# Patient Record
Sex: Male | Born: 1990 | Race: White | Hispanic: No | Marital: Single | Smoking: Current every day smoker
Health system: Southern US, Community
[De-identification: ages and names within clinical notes are randomized; demographics above are authoritative.]

## PROBLEM LIST (undated history)

## (undated) DIAGNOSIS — F410 Panic disorder [episodic paroxysmal anxiety] without agoraphobia: Secondary | ICD-10-CM

## (undated) DIAGNOSIS — F419 Anxiety disorder, unspecified: Secondary | ICD-10-CM

## (undated) DIAGNOSIS — K219 Gastro-esophageal reflux disease without esophagitis: Secondary | ICD-10-CM

---
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
## 2011-06-25 ENCOUNTER — Encounter (HOSPITAL_BASED_OUTPATIENT_CLINIC_OR_DEPARTMENT_OTHER): Payer: Self-pay | Admitting: Emergency Medicine

## 2011-06-25 ENCOUNTER — Emergency Department (HOSPITAL_BASED_OUTPATIENT_CLINIC_OR_DEPARTMENT_OTHER)
Admission: EM | Admit: 2011-06-25 | Discharge: 2011-06-25 | Disposition: A | Discharge status: Home / Self Care | Payer: 59 | Attending: Emergency Medicine | Admitting: Emergency Medicine

## 2011-06-25 DIAGNOSIS — S01409A Unspecified open wound of unspecified cheek and temporomandibular area, initial encounter: Secondary | ICD-10-CM | POA: Insufficient documentation

## 2011-06-25 DIAGNOSIS — W540XXA Bitten by dog, initial encounter: Secondary | ICD-10-CM | POA: Insufficient documentation

## 2011-06-25 DIAGNOSIS — S0185XA Open bite of other part of head, initial encounter: Secondary | ICD-10-CM

## 2011-06-25 DIAGNOSIS — J45909 Unspecified asthma, uncomplicated: Secondary | ICD-10-CM | POA: Insufficient documentation

## 2011-06-25 DIAGNOSIS — K219 Gastro-esophageal reflux disease without esophagitis: Secondary | ICD-10-CM | POA: Insufficient documentation

## 2011-06-25 HISTORY — DX: Gastro-esophageal reflux disease without esophagitis: K21.9

## 2011-06-25 MED ORDER — FLUORESCEIN SODIUM 1 MG OP STRP
ORAL_STRIP | OPHTHALMIC | Status: AC
  Filled 2011-06-25: qty 1

## 2011-06-25 MED ORDER — TETRACAINE HCL 0.5 % OP SOLN
OPHTHALMIC | Status: AC
  Filled 2011-06-25: qty 2

## 2011-06-25 MED ORDER — IBUPROFEN 800 MG PO TABS: 800.0000 mg | ORAL_TABLET | Freq: Three times a day (TID) | ORAL | Status: AC

## 2011-06-25 MED ORDER — AMOXICILLIN-POT CLAVULANATE 875-125 MG PO TABS
1.0000 | ORAL_TABLET | Freq: Once | ORAL | Status: AC
  Administered 2011-06-25: 1 via ORAL
  Filled 2011-06-25: qty 1

## 2011-06-25 MED ORDER — AMOXICILLIN-POT CLAVULANATE 875-125 MG PO TABS: 1.0000 | ORAL_TABLET | Freq: Two times a day (BID) | ORAL | Status: AC

## 2011-06-25 MED ORDER — TETANUS-DIPHTH-ACELL PERTUSSIS 5-2.5-18.5 LF-MCG/0.5 IM SUSP: 0.5000 mL | Freq: Once | INTRAMUSCULAR | Status: DC

## 2011-06-25 NOTE — ED Notes (Signed)
Pt has laceration to right cheek from dog bite.

## 2011-06-25 NOTE — ED Provider Notes (Signed)
History     CSN: [id number]  Arrival date & time 06/25/11  8295   First MD Initiated Contact with Patient 06/25/11 0118      Chief Complaint  Patient presents with  . Animal Bite    (Consider location/radiation/quality/duration/timing/severity/associated sxs/prior treatment) HPI Dog bite prior to arrival. Known animal with immunizations up-to-date. Patient's tetanus is up-to-date. Dog bit right cheek. Bleeding controlled prior to arrival. Sharp pain. No radiation of pain. Moderate in severity. No known aggravating or alleviating factors. Unchanged since onset. No associated dental injury, nosebleed. No other bite or injury. Past Medical History  Diagnosis Date  . Asthma   . GERD (gastroesophageal reflux disease)     History reviewed. No pertinent past surgical history.  No family history on file.  History  Substance Use Topics  . Smoking status: Current Everyday Smoker  . Smokeless tobacco: Not on file  . Alcohol Use: Yes      Review of Systems  Constitutional: Negative for fever and chills.  HENT: Negative for neck pain and neck stiffness.   Eyes: Negative for pain.  Respiratory: Negative for shortness of breath.   Cardiovascular: Negative for chest pain.  Gastrointestinal: Negative for abdominal pain.  Genitourinary: Negative for dysuria.  Musculoskeletal: Negative for back pain.  Skin: Positive for wound. Negative for rash.  Neurological: Negative for headaches.  All other systems reviewed and are negative.    Allergies  Review of patient's allergies indicates no known allergies.  Home Medications   Current Outpatient Rx  Name Route Sig Dispense Refill  . OMEPRAZOLE 20 MG PO CPDR Oral Take 20 mg by mouth daily.      BP 133/70  Pulse 103  Temp(Src) 98.6 F (37 C) (Oral)  Resp 20  Ht 6' 1.5" (1.867 m)  Wt 285 lb (129.275 kg)  BMI 37.09 kg/m2  SpO2 100%  Physical Exam  Constitutional: He is oriented to person, place, and time. He appears  well-developed and well-nourished.  HENT:  Head: Normocephalic.       Superficial linear wound to right face with minimal gape. No active bleeding. No associated midface instability or bony tenderness. No epistaxis. No septal hematoma. Extraocular movements intact and conjunctiva clear. No trismus. No dental tenderness.  Eyes: Conjunctivae and EOM are normal. Pupils are equal, round, and reactive to light.  Neck: Trachea normal. Neck supple.  Cardiovascular: Normal rate, regular rhythm, S1 normal, S2 normal and normal pulses.     No systolic murmur is present   No diastolic murmur is present  Pulses:      Radial pulses are 2+ on the right side, and 2+ on the left side.  Pulmonary/Chest: Effort normal and breath sounds normal. He has no wheezes. He has no rhonchi. He has no rales. He exhibits no tenderness.  Abdominal: Soft. Normal appearance and bowel sounds are normal. There is no tenderness. There is no CVA tenderness and negative Murphy's sign.  Musculoskeletal:       BLE:s Calves nontender, no cords or erythema, negative Homans sign  Neurological: He is alert and oriented to person, place, and time. He has normal strength. No cranial nerve deficit or sensory deficit. GCS eye subscore is 4. GCS verbal subscore is 5. GCS motor subscore is 6.  Skin: Skin is warm and dry. No rash noted. He is not diaphoretic.  Psychiatric: His speech is normal.       Cooperative and appropriate    ED Course  Procedures (including critical care time)  Wound  irrigated extensively with normal saline. Wound washed with soap and water.  Augmentin provided.  Tetanus up-to-date.  MDM   Dog bite to face. Superficial. Decision made to place Steri-Strips and not sutures. Plan recheck 48 hours. Reliable historian verbalizes understanding infection precautions and need for close followup. Prescription for Augmentin provided.        Sunnie Nielsen, MD 06/25/11 [phone]

## 2011-06-25 NOTE — ED Notes (Signed)
Wound to right cheek area irrigated with NS, pt tolerated well

## 2011-06-25 NOTE — Discharge Instructions (Signed)
Animal Bite An animal bite can result in a scratch on the skin, deep open cut, puncture of the skin, crush injury, or tearing away of the skin or a body part. Dogs are responsible for most animal bites. Children are bitten more often than adults. An animal bite can range from very mild to more serious. A small bite from your house pet is no cause for alarm. However, some animal bites can become infected or injure a bone or other tissue. You must seek medical care if:  The skin is broken and bleeding does not slow down or stop after 15 minutes.   The puncture is deep and difficult to clean (such as a cat bite).   Pain, warmth, redness, or pus develops around the wound.   The bite is from a stray animal or rodent. There may be a risk of rabies infection.   The bite is from a snake, raccoon, skunk, fox, coyote, or bat. There may be a risk of rabies infection.   The person bitten has a chronic illness such as diabetes, liver disease, or cancer, or the person takes medicine that lowers the immune system.   There is concern about the location and severity of the bite.  It is important to clean and protect an animal bite wound right away to prevent infection. Follow these steps:  Clean the wound with plenty of water and soap.   Apply an antibiotic cream.   Apply gentle pressure over the wound with a clean towel or gauze to slow or stop bleeding.   Elevate the affected area above the heart to help stop any bleeding.   Seek medical care. Getting medical care within 8 hours of the animal bite leads to the best possible outcome.  DIAGNOSIS  Your caregiver will most likely:  Take a detailed history of the animal and the bite injury.   Perform a wound exam.   Take your medical history.  Blood tests or X-rays may be performed. Sometimes, infected bite wounds are cultured and sent to a lab to identify the infectious bacteria.  TREATMENT  Medical treatment will depend on the location and type of  animal bite as well as the patient's medical history. Treatment may include:  Wound care, such as cleaning and flushing the wound with saline solution, bandaging, and elevating the affected area.   Antibiotics.   Tetanus immunization.   Rabies immunization.   Leaving the wound open to heal. This is often done with animal bites, due to the high risk of infection. However, in certain cases, wound closure with stitches, wound adhesive, skin adhesive strips, or staples may be used.  Infected bites that are left untreated may require intravenous (IV) antibiotics and surgical treatment in the hospital. HOME CARE INSTRUCTIONS  Follow your caregiver's instructions for wound care.   Take all medicines as directed.   If your caregiver prescribes antibiotics, take them as directed. Finish them even if you start to feel better.   Follow up with your caregiver for further exams or immunizations as directed.  You may need a tetanus shot if:  You cannot remember when you had your last tetanus shot.   You have never had a tetanus shot.   The injury broke your skin.  If you get a tetanus shot, your arm may swell, get red, and feel warm to the touch. This is common and not a problem. If you need a tetanus shot and you choose not to have one, there is a   rare chance of getting tetanus. Sickness from tetanus can be serious. SEEK MEDICAL CARE IF:  You notice warmth, redness, soreness, swelling, pus discharge, or a bad smell coming from the wound.   You have a red line on the skin coming from the wound.   You have a fever, chills, or a general ill feeling.   You have nausea or vomiting.   You have continued or worsening pain.   You have trouble moving the injured part.   You have other questions or concerns.  MAKE SURE YOU:  Understand these instructions.   Will watch your condition.   Will get help right away if you are not doing well or get worse.  Document Released: 12/18/2010  Document Revised: 03/21/2011 Document Reviewed: 12/18/2010 [facility] Patient Information 2012 [place], [place]  .Facial Laceration A facial laceration is a cut on the face. Lacerations usually heal quickly, but they need special care to reduce scarring. It will take 1 to 2 years for the scar to lose its redness and to heal completely. TREATMENT  Some facial lacerations may not require closure. Some lacerations may not be able to be closed due to an increased risk of infection. It is important to see your caregiver as soon as possible after an injury to minimize the risk of infection and to maximize the opportunity for successful closure. If closure is appropriate, pain medicines may be given, if needed. The wound will be cleaned to help prevent infection. Your caregiver will use stitches (sutures), staples, wound glue (adhesive), or skin adhesive strips to repair the laceration. These tools bring the skin edges together to allow for faster healing and a better cosmetic outcome. However, all wounds will heal with a scar.  Once the wound has healed, scarring can be minimized by covering the wound with sunscreen during the day for 1 full year. Use a sunscreen with an SPF of at least 30. Sunscreen helps to reduce the pigment that will form in the scar. When applying sunscreen to a completely healed wound, massage the scar for a few minutes to help reduce the appearance of the scar. Use circular motions with your fingertips, on and around the scar. Do not massage a healing wound.   Keep the wound clean and dry.   Do not get the skin adhesive strips wet. You may bathe carefully, using caution to keep the wound dry.   If the wound gets wet, pat it dry with a clean towel.   Skin adhesive strips will fall off on their own. You may trim the strips as the wound heals. Do not remove skin adhesive strips that are still stuck to the wound. They will fall off in time.   SEEK IMMEDIATE MEDICAL CARE IF:  You  develop redness, pain, or swelling around the wound.   There is yellowish-white fluid (pus) coming from the wound.   You develop chills or a fever.

## 2011-06-25 NOTE — ED Notes (Signed)
Pt states that he had a tetanus shot 1 year ago

## 2012-02-06 ENCOUNTER — Emergency Department (HOSPITAL_BASED_OUTPATIENT_CLINIC_OR_DEPARTMENT_OTHER): Payer: 59

## 2012-02-06 ENCOUNTER — Encounter (HOSPITAL_BASED_OUTPATIENT_CLINIC_OR_DEPARTMENT_OTHER): Payer: Self-pay | Admitting: *Deleted

## 2012-02-06 ENCOUNTER — Emergency Department (HOSPITAL_BASED_OUTPATIENT_CLINIC_OR_DEPARTMENT_OTHER)
Admission: EM | Admit: 2012-02-06 | Discharge: 2012-02-06 | Disposition: A | Discharge status: Home / Self Care | Payer: 59 | Attending: Emergency Medicine | Admitting: Emergency Medicine

## 2012-02-06 DIAGNOSIS — R0602 Shortness of breath: Secondary | ICD-10-CM | POA: Insufficient documentation

## 2012-02-06 DIAGNOSIS — K219 Gastro-esophageal reflux disease without esophagitis: Secondary | ICD-10-CM | POA: Insufficient documentation

## 2012-02-06 DIAGNOSIS — J40 Bronchitis, not specified as acute or chronic: Secondary | ICD-10-CM | POA: Insufficient documentation

## 2012-02-06 DIAGNOSIS — F17200 Nicotine dependence, unspecified, uncomplicated: Secondary | ICD-10-CM | POA: Insufficient documentation

## 2012-02-06 DIAGNOSIS — J45909 Unspecified asthma, uncomplicated: Secondary | ICD-10-CM | POA: Insufficient documentation

## 2012-02-06 MED ORDER — ALBUTEROL SULFATE HFA 108 (90 BASE) MCG/ACT IN AERS: 1.0000 | INHALATION_SPRAY | Freq: Four times a day (QID) | RESPIRATORY_TRACT | Status: DC | PRN

## 2012-02-06 MED ORDER — AZITHROMYCIN 250 MG PO TABS: ORAL_TABLET | ORAL | Status: DC

## 2012-02-06 NOTE — ED Provider Notes (Signed)
History     CSN: [id number]  Arrival date & time 02/06/12  1541   First MD Initiated Contact with Patient 02/06/12 1556      Chief Complaint  Patient presents with  . URI    (Consider location/radiation/quality/duration/timing/severity/associated sxs/prior treatment) Patient is a 21 y.o. male presenting with cough. The history is provided by the patient. No language interpreter was used.  Cough This is a new problem. The current episode started yesterday. The problem occurs constantly. The problem has been gradually worsening. The cough is productive of sputum. There has been no fever. Associated symptoms include shortness of breath. He has tried nothing for the symptoms. He is a smoker. His past medical history is significant for asthma. His past medical history does not include pneumonia.  Pt complains of chest feeling tight and feeling congested  Past Medical History  Diagnosis Date  . Asthma   . GERD (gastroesophageal reflux disease)     History reviewed. No pertinent past surgical history.  No family history on file.  History  Substance Use Topics  . Smoking status: Current Every Day Smoker -- 0.5 packs/day    Types: Cigarettes  . Smokeless tobacco: Not on file  . Alcohol Use: Yes      Review of Systems  Respiratory: Positive for cough and shortness of breath.   All other systems reviewed and are negative.    Allergies  Review of patient's allergies indicates no known allergies.  Home Medications   Current Outpatient Rx  Name Route Sig Dispense Refill  . OMEPRAZOLE 20 MG PO CPDR Oral Take 20 mg by mouth daily.      BP 127/97  Pulse 85  Temp 99.3 F (37.4 C) (Oral)  Resp 20  Ht 6' 2.5" (1.892 m)  Wt 255 lb (115.667 kg)  BMI 32.30 kg/m2  SpO2 100%  Physical Exam  Nursing note and vitals reviewed. Constitutional: He is oriented to person, place, and time. He appears well-developed and well-nourished.  HENT:  Head: Normocephalic.  Right Ear:  External ear normal.  Left Ear: External ear normal.  Nose: Nose normal.  Mouth/Throat: Oropharynx is clear and moist.  Eyes: Conjunctivae normal and EOM are normal. Pupils are equal, round, and reactive to light.  Neck: Normal range of motion. Neck supple.  Cardiovascular: Normal rate and normal heart sounds.   Pulmonary/Chest: Effort normal and breath sounds normal.  Abdominal: Soft.  Neurological: He is alert and oriented to person, place, and time.  Skin: Skin is warm.  Psychiatric: He has a normal mood and affect.    ED Course  Procedures (including critical care time)  Labs Reviewed - No data to display Dg Chest 2 View  02/06/2012  *RADIOLOGY REPORT*  Clinical Data: Cough, congestion, smoker, asthma  CHEST - 2 VIEW  Comparison: 12/19/2008  Findings: Normal heart size, mediastinal contours, and pulmonary vascularity. Minimal chronic peribronchial thickening. Lungs otherwise clear. No pleural effusion or pneumothorax. No acute bony abnormalities.  IMPRESSION: Minimal chronic peribronchial thickening which could reflect chronic bronchitis or asthma. No acute abnormalities.   Original Report Authenticated By: Lollie Marrow, M.D.      No diagnosis found.    MDM  Chest xray no pneumonia,   Pt given rx for zithromax and albuterol inhaler.          [name], [place] 02/06/12 1711

## 2012-02-06 NOTE — ED Notes (Signed)
States he thinks he has a sinus infection. Has a stuffy nose, cough, and ear pain. Symptoms started 3 days ago.

## 2012-02-07 NOTE — ED Provider Notes (Deleted)
Medical screening examination/treatment/procedure(s) were conducted as a shared visit with non-physician practitioner(s) and myself.  I personally evaluated the patient during the encounter   Shelda Jakes, MD 02/07/12 1259

## 2012-02-07 NOTE — ED Provider Notes (Signed)
Medical screening examination/treatment/procedure(s) were performed by non-physician practitioner and as supervising physician I was immediately available for consultation/collaboration.   Not a shared visit.    Shelda Jakes, MD 02/07/12 [phone]

## 2013-05-02 ENCOUNTER — Emergency Department (HOSPITAL_BASED_OUTPATIENT_CLINIC_OR_DEPARTMENT_OTHER): Payer: Managed Care, Other (non HMO)

## 2013-05-02 ENCOUNTER — Emergency Department (HOSPITAL_BASED_OUTPATIENT_CLINIC_OR_DEPARTMENT_OTHER)
Admission: EM | Admit: 2013-05-02 | Discharge: 2013-05-02 | Disposition: A | Discharge status: Home / Self Care | Payer: Managed Care, Other (non HMO) | Attending: Emergency Medicine | Admitting: Emergency Medicine

## 2013-05-02 ENCOUNTER — Encounter (HOSPITAL_BASED_OUTPATIENT_CLINIC_OR_DEPARTMENT_OTHER): Payer: Self-pay | Admitting: Emergency Medicine

## 2013-05-02 DIAGNOSIS — Z79899 Other long term (current) drug therapy: Secondary | ICD-10-CM | POA: Insufficient documentation

## 2013-05-02 DIAGNOSIS — J029 Acute pharyngitis, unspecified: Secondary | ICD-10-CM | POA: Insufficient documentation

## 2013-05-02 DIAGNOSIS — F17200 Nicotine dependence, unspecified, uncomplicated: Secondary | ICD-10-CM | POA: Insufficient documentation

## 2013-05-02 DIAGNOSIS — J45909 Unspecified asthma, uncomplicated: Secondary | ICD-10-CM | POA: Insufficient documentation

## 2013-05-02 DIAGNOSIS — IMO0002 Reserved for concepts with insufficient information to code with codable children: Secondary | ICD-10-CM | POA: Insufficient documentation

## 2013-05-02 DIAGNOSIS — K219 Gastro-esophageal reflux disease without esophagitis: Secondary | ICD-10-CM | POA: Insufficient documentation

## 2013-05-02 DIAGNOSIS — J40 Bronchitis, not specified as acute or chronic: Secondary | ICD-10-CM

## 2013-05-02 MED ORDER — AEROCHAMBER PLUS W/MASK MISC: Status: DC

## 2013-05-02 MED ORDER — ALBUTEROL SULFATE HFA 108 (90 BASE) MCG/ACT IN AERS: 2.0000 | INHALATION_SPRAY | RESPIRATORY_TRACT | Status: DC | PRN

## 2013-05-02 NOTE — Discharge Instructions (Signed)
Bronchitis Use your albuterol inhaler as directed. See Dr. Lysbeth GalasNyland in the office if not improved in 2 weeks. Ask Dr. Lysbeth GalasNyland to help you to stop smoking. Return if you feel worse for any reason Bronchitis is swelling (inflammation) of the air tubes leading to your lungs (bronchi). This causes mucus and a cough. If the swelling gets bad, you may have trouble breathing. HOME CARE   Rest.  Drink enough fluids to keep your pee (urine) clear or pale yellow (unless you have a condition where you have to watch how much you drink).  Only take medicine as told by your doctor. If you were given antibiotic medicines, finish them even if you start to feel better.  Avoid smoke, irritating chemicals, and strong smells. These make the problem worse. Quit smoking if you smoke. This helps your lungs heal faster.  Use a cool mist humidifier. Change the water in the humidifier every day. You can also sit in the bathroom with hot shower running for 5 10 minutes. Keep the door closed.  See your health care provider as told.  Wash your hands often. GET HELP IF: Your problems do not get better after 1 week. GET HELP RIGHT AWAY IF:   Your fever gets worse.  You have chills.  Your chest hurts.  Your problems breathing get worse.  You have blood in your mucus.  You pass out (faint).  You feel lightheaded.  You have a bad headache.  You throw up (vomit) again and again. MAKE SURE YOU:  Understand these instructions.  Will watch your condition.  Will get help right away if you are not doing well or get worse. Document Released: 09/18/2007 Document Revised: 01/20/2013 Document Reviewed: 11/24/2012 [facility] Patient Information 2014 [place], [place].

## 2013-05-02 NOTE — ED Provider Notes (Signed)
CSN: [id number]     Arrival date & time 05/02/13  1045 History   First MD Initiated Contact with Patient 05/02/13 1357     Chief Complaint  Patient presents with  . Cough   (Consider location/radiation/quality/duration/timing/severity/associated sxs/prior Treatment) HPI Complains of cough productive of yellow sputum for the past 3 days. No shortness of breath. Treated himself with cough medicine over-the-counter, without relief. Maximum temperature 98.9. Also admits to slight sore throat. No other associated symptoms. Nothing makes symptoms better or worse. Past Medical History  Diagnosis Date  . Asthma   . GERD (gastroesophageal reflux disease)    No past surgical history on file. No family history on file. History  Substance Use Topics  . Smoking status: Current Every Day Smoker -- 0.50 packs/day    Types: Cigarettes  . Smokeless tobacco: Not on file  . Alcohol Use: Yes    Review of Systems  Constitutional: Negative.   HENT: Positive for sore throat.   Respiratory: Positive for cough.   Cardiovascular: Negative.   Gastrointestinal: Negative.   Musculoskeletal: Negative.   Skin: Negative.   Neurological: Negative.   Psychiatric/Behavioral: Negative.   All other systems reviewed and are negative.    Allergies  Review of patient's allergies indicates no known allergies.  Home Medications   Current Outpatient Rx  Name  Route  Sig  Dispense  Refill  . budesonide-formoterol (SYMBICORT) 160-4.5 MCG/ACT inhaler   Inhalation   Inhale 2 puffs into the lungs 2 (two) times daily.         Marland Kitchen albuterol (PROVENTIL HFA;VENTOLIN HFA) 108 (90 BASE) MCG/ACT inhaler   Inhalation   Inhale 2 puffs into the lungs every 2 (two) hours as needed for wheezing or shortness of breath (cough).   1 Inhaler   0   . omeprazole (PRILOSEC) 20 MG capsule   Oral   Take 20 mg by mouth daily.         Marland Kitchen Spacer/Aero-Holding Chambers (AEROCHAMBER PLUS WITH MASK) inhaler      Use as  instructed   1 each   2    BP 125/68  Pulse 80  Temp(Src) 98.6 F (37 C) (Oral)  Resp 18  Ht 6\' 2"  (1.88 m)  Wt 235 lb (106.595 kg)  BMI 30.16 kg/m2  SpO2 100% Physical Exam  Nursing note and vitals reviewed. Constitutional: He appears well-developed and well-nourished.  HENT:  Head: Normocephalic and atraumatic.  Right Ear: External ear normal.  Left Ear: External ear normal.  Nose: Nose normal.  Bilateral tympanic membranes normal. Oral pharynx minimally reddened. No exudate uvula midline  Eyes: Conjunctivae are normal. Pupils are equal, round, and reactive to light.  Neck: Neck supple. No tracheal deviation present. No thyromegaly present.  Cardiovascular: Normal rate and regular rhythm.   No murmur heard. Pulmonary/Chest: Effort normal and breath sounds normal.  Abdominal: Soft. Bowel sounds are normal. He exhibits no distension. There is no tenderness.  Musculoskeletal: Normal range of motion. He exhibits no edema and no tenderness.  Lymphadenopathy:    He has no cervical adenopathy.  Neurological: He is alert. Coordination normal.  Skin: Skin is warm and dry. No rash noted.  Psychiatric: He has a normal mood and affect.   Chest x-ray viewed by me ED Course  Procedures (including critical care time) Labs Review Labs Reviewed - No data to display Imaging Review Dg Chest 2 View  05/02/2013   CLINICAL DATA:  Productive cough.  Mild fever.  EXAM: CHEST  2 VIEW  COMPARISON:  Two-view chest 03/05/2013  FINDINGS: The heart size is normal. The lungs are clear. Mild parahilar bronchitic changes appear chronic. No focal airspace consolidation is evident. The visualized soft tissues and bony thorax are unremarkable.  IMPRESSION: 1. Mild perihilar bronchitic changes appear chronic. This may represent acute exacerbation. 2. No focal airspace consolidation.   Electronically Signed   By: Gennette Pachris  Mattern M.D.   On: 05/02/2013 12:02    EKG Interpretation   None     No results  found for this or any previous visit. Dg Chest 2 View  05/02/2013   CLINICAL DATA:  Productive cough.  Mild fever.  EXAM: CHEST  2 VIEW  COMPARISON:  Two-view chest 03/05/2013  FINDINGS: The heart size is normal. The lungs are clear. Mild parahilar bronchitic changes appear chronic. No focal airspace consolidation is evident. The visualized soft tissues and bony thorax are unremarkable.  IMPRESSION: 1. Mild perihilar bronchitic changes appear chronic. This may represent acute exacerbation. 2. No focal airspace consolidation.   Electronically Signed   By: Gennette Pachris  Mattern M.D.   On: 05/02/2013 12:02     MDM   1. Bronchitis    Counseled patient for 5 minutes that he needs to stop smoking. Plan albuterol inhaler with spacer to use 2 puffs every 4 hours when necessary cough or shortness of breath. Follow up with Dr. Lysbeth GalasNyland as needed 2 weeks Diagnosis #1 acute bronchitis #2 tobacco abuse    Doug SouSam Sharise Lippy, MD 05/02/13 1409

## 2013-05-02 NOTE — ED Notes (Signed)
Pt having productive cough x 3 days. Yellow green sputum.  Mild fever.

## 2013-10-27 ENCOUNTER — Emergency Department (HOSPITAL_COMMUNITY): Payer: Worker's Compensation

## 2013-10-27 ENCOUNTER — Encounter (HOSPITAL_COMMUNITY): Payer: Self-pay | Admitting: Emergency Medicine

## 2013-10-27 ENCOUNTER — Emergency Department (HOSPITAL_COMMUNITY)
Admission: EM | Admit: 2013-10-27 | Discharge: 2013-10-27 | Disposition: A | Discharge status: Home / Self Care | Payer: Worker's Compensation | Attending: Emergency Medicine | Admitting: Emergency Medicine

## 2013-10-27 DIAGNOSIS — Z79899 Other long term (current) drug therapy: Secondary | ICD-10-CM | POA: Insufficient documentation

## 2013-10-27 DIAGNOSIS — F17200 Nicotine dependence, unspecified, uncomplicated: Secondary | ICD-10-CM | POA: Insufficient documentation

## 2013-10-27 DIAGNOSIS — W230XXA Caught, crushed, jammed, or pinched between moving objects, initial encounter: Secondary | ICD-10-CM | POA: Insufficient documentation

## 2013-10-27 DIAGNOSIS — Y9289 Other specified places as the place of occurrence of the external cause: Secondary | ICD-10-CM | POA: Insufficient documentation

## 2013-10-27 DIAGNOSIS — K219 Gastro-esophageal reflux disease without esophagitis: Secondary | ICD-10-CM | POA: Insufficient documentation

## 2013-10-27 DIAGNOSIS — Z23 Encounter for immunization: Secondary | ICD-10-CM | POA: Insufficient documentation

## 2013-10-27 DIAGNOSIS — J45909 Unspecified asthma, uncomplicated: Secondary | ICD-10-CM | POA: Insufficient documentation

## 2013-10-27 DIAGNOSIS — Y939 Activity, unspecified: Secondary | ICD-10-CM | POA: Insufficient documentation

## 2013-10-27 DIAGNOSIS — S61209A Unspecified open wound of unspecified finger without damage to nail, initial encounter: Secondary | ICD-10-CM | POA: Insufficient documentation

## 2013-10-27 DIAGNOSIS — S61219A Laceration without foreign body of unspecified finger without damage to nail, initial encounter: Secondary | ICD-10-CM

## 2013-10-27 MED ORDER — HYDROCODONE-ACETAMINOPHEN 5-325 MG PO TABS: 1.0000 | ORAL_TABLET | ORAL | Status: DC | PRN

## 2013-10-27 MED ORDER — POVIDONE-IODINE 10 % EX SOLN
CUTANEOUS | Status: AC
  Filled 2013-10-27: qty 118

## 2013-10-27 MED ORDER — TETANUS-DIPHTH-ACELL PERTUSSIS 5-2.5-18.5 LF-MCG/0.5 IM SUSP
0.5000 mL | Freq: Once | INTRAMUSCULAR | Status: AC
  Administered 2013-10-27: 0.5 mL via INTRAMUSCULAR
  Filled 2013-10-27: qty 0.5

## 2013-10-27 MED ORDER — LIDOCAINE HCL (PF) 2 % IJ SOLN
INTRAMUSCULAR | Status: AC
  Filled 2013-10-27: qty 10

## 2013-10-27 NOTE — ED Notes (Signed)
Pt right index finger dressing removed. Two medium size lacerations noted to right index finger. No active bleeding noted.

## 2013-10-27 NOTE — ED Notes (Signed)
Pt reports finger got caught between two railings at work. Pt reports laceration to right index finger. Pt was seen at PCP prior to arrival and was sent here for finger to be "sown up". Pt alert and oriented. Airway patent. Site dressed no active bleeding noted.

## 2013-10-27 NOTE — ED Provider Notes (Signed)
CSN: [id number]     Arrival date & time 10/27/13  1049 History   First MD Initiated Contact with Patient 10/27/13 1140     No chief complaint on file.    (Consider location/radiation/quality/duration/timing/severity/associated sxs/prior Treatment) HPI Comments: Patient presents to the emergency department with complaint of injury to the right index finger. The patient states he was working with types, when his finger got caught between the pipes in the railings on the trunk. The pipe went through his" and injured his right index finger. The patient is unsure of the date of his last tetanus. He denies any anticoagulation medications. He denies any previous operations or procedures involving the right upper extremity. He denies any illnesses that would compromise his immune system at this time. He presents to the emergency department to have his finger" sewn up". No other injuries reported.  The history is provided by the patient.    Past Medical History  Diagnosis Date  . Asthma   . GERD (gastroesophageal reflux disease)    History reviewed. No pertinent past surgical history. History reviewed. No pertinent family history. History  Substance Use Topics  . Smoking status: Current Every Day Smoker -- 0.50 packs/day    Types: Cigarettes  . Smokeless tobacco: Not on file  . Alcohol Use: Yes     Comment: occasional    Review of Systems  Constitutional: Negative for activity change.       All ROS Neg except as noted in HPI  HENT: Negative for nosebleeds.   Eyes: Negative for photophobia and discharge.  Respiratory: Negative for cough, shortness of breath and wheezing.   Cardiovascular: Negative for chest pain and palpitations.  Gastrointestinal: Negative for abdominal pain and blood in stool.  Genitourinary: Negative for dysuria, frequency and hematuria.  Musculoskeletal: Negative for arthralgias, back pain and neck pain.  Skin: Negative.   Neurological: Negative for dizziness,  seizures and speech difficulty.  Psychiatric/Behavioral: Negative for hallucinations and confusion.      Allergies  Review of patient's allergies indicates no known allergies.  Home Medications   Prior to Admission medications   Medication Sig Start Date End Date Taking? Authorizing Provider  albuterol (PROVENTIL HFA;VENTOLIN HFA) 108 (90 BASE) MCG/ACT inhaler Inhale 2 puffs into the lungs every 2 (two) hours as needed for wheezing or shortness of breath (cough). 05/02/13   Doug Sou, MD  budesonide-formoterol (SYMBICORT) 160-4.5 MCG/ACT inhaler Inhale 2 puffs into the lungs 2 (two) times daily.    Historical Provider, MD  omeprazole (PRILOSEC) 20 MG capsule Take 20 mg by mouth daily.    Historical Provider, MD  Spacer/Aero-Holding Chambers (AEROCHAMBER PLUS WITH MASK) inhaler Use as instructed 05/02/13   Doug Sou, MD   BP 131/71  Pulse 79  Temp(Src) 98 F (36.7 C) (Oral)  Resp 18  Ht 6\' 3"  (1.905 m)  Wt 256 lb (116.121 kg)  BMI 32.00 kg/m2  SpO2 100% Physical Exam  Nursing note and vitals reviewed. Constitutional: He is oriented to person, place, and time. He appears well-developed and well-nourished.  Non-toxic appearance.  HENT:  Head: Normocephalic.  Right Ear: Tympanic membrane and external ear normal.  Left Ear: Tympanic membrane and external ear normal.  Eyes: EOM and lids are normal. Pupils are equal, round, and reactive to light.  Neck: Normal range of motion. Neck supple. Carotid bruit is not present.  Cardiovascular: Normal rate, regular rhythm, normal heart sounds, intact distal pulses and normal pulses.   Pulmonary/Chest: Breath sounds normal. No respiratory distress.  Abdominal: Soft. Bowel sounds are normal. There is no tenderness. There is no guarding.  Musculoskeletal: Normal range of motion.  There is a 1 cm laceration at the DIP area and one centimeter laceration just below the DIP area of the right index finger. The patient has good flexion, that  seems to have difficulty with extension, particularly at the DIP joint area. There is mild to moderate soreness present. Bleeding is controlled. Capillary refill is less than 2 seconds.  Lymphadenopathy:       Head (right side): No submandibular adenopathy present.       Head (left side): No submandibular adenopathy present.    He has no cervical adenopathy.  Neurological: He is alert and oriented to person, place, and time. He has normal strength. No cranial nerve deficit or sensory deficit. He exhibits normal muscle tone. Coordination normal.  Skin: Skin is warm and dry.  Psychiatric: He has a normal mood and affect. His speech is normal.    ED Course  LACERATION REPAIR Date/Time: 10/27/2013 4:59 PM Performed by: Kathie DikeBRYANT, Trumaine Wimer M Authorized by: Kathie DikeBRYANT, Solace Manwarren M Consent: Verbal consent obtained. Risks and benefits: risks, benefits and alternatives were discussed Consent given by: patient Patient understanding: patient states understanding of the procedure being performed Patient identity confirmed: arm band Time out: Immediately prior to procedure a "time out" was called to verify the correct patient, procedure, equipment, support staff and site/side marked as required. Body area: upper extremity Location details: right index finger Laceration length: 2 cm Foreign bodies: no foreign bodies Tendon involvement: complex Nerve involvement: none Vascular damage: no Anesthesia: digital block Local anesthetic: lidocaine 2% without epinephrine Patient sedated: no Preparation: Patient was prepped and draped in the usual sterile fashion. Irrigation solution: saline Irrigation method: tap Amount of cleaning: extensive Debridement: none Degree of undermining: none Skin closure: 4-0 nylon Number of sutures: 5 Technique: simple Approximation: loose Dressing: splint Patient tolerance: Patient tolerated the procedure well with no immediate complications. Comments: Patient continues to have  difficulty extending the distal portion of the index finger from the DIP to the tip. I cannot visualize a tendon injury at this time. Patient fitted with a finger splint and dressing.   (including critical care time) Labs Review Labs Reviewed - No data to display  Imaging Review Dg Finger Index Right  10/27/2013   CLINICAL DATA:  Laceration right index finger.  EXAM: RIGHT INDEX FINGER 2+V  COMPARISON:  None.  FINDINGS: No fracture, dislocation or radiopaque foreign body is identified.  IMPRESSION: Negative exam.   Electronically Signed   By: Drusilla Kannerhomas  Dalessio M.D.   On: 10/27/2013 11:33     EKG Interpretation None      MDM X-ray of the right index finger is negative for fracture, dislocation, or foreign body. The patient seems to have difficulty with extension of the distal portion of the right index finger. I have contacted Dr. Bari Edwardrtman's office for hand surgery evaluation. The patient will be seen tomorrow July 16 at 10 AM. The patient is placed in a finger splint. He is given medication for discomfort I advised to keep his hand elevated as much as possible.    Final diagnoses:  None    *I have reviewed nursing notes, vital signs, and all appropriate lab and imaging results for this patient.Kathie Dike**    Delvin Hedeen M Ellieana Dolecki, PA-C 10/27/13 [phone]

## 2013-10-27 NOTE — Discharge Instructions (Signed)
You have 2 lacerations of the right index finger. Your examination is consistent with an extensor tendon injury. Please leave the splint in place until seen by the hand specialist. Please apply a glove or a bag with a rubber band or twisting type if you get in the shower. Use Tylenol for mild pain, use Norco for more severe pain. Please see Dr Orlan Leavensrtman or a member of his staff tomorrow at [employer]10AM. Stitches, Staples, or Skin Adhesive Strips  Stitches (sutures), staples, and skin adhesive strips hold the skin together as it heals. They will usually be in place for 7 days or less. HOME CARE  Wash your hands with soap and water before and after you touch your wound.  Only take medicine as told by your doctor.  Cover your wound only if your doctor told you to. Otherwise, leave it open to air.  Do not get your stitches wet or dirty. If they get dirty, dab them gently with a clean washcloth. Wet the washcloth with soapy water. Do not rub. Pat them dry gently.  Do not put medicine or medicated cream on your stitches unless your doctor told you to.  Do not take out your own stitches or staples. Skin adhesive strips will fall off by themselves.  Do not pick at the wound. Picking can cause an infection.  Do not miss your follow-up appointment.  If you have problems or questions, call your doctor. GET HELP RIGHT AWAY IF:   You have a temperature by mouth above 102 F (38.9 C), not controlled by medicine.  You have chills.  You have redness or pain around your stitches.  There is puffiness (swelling) around your stitches.  You notice fluid (drainage) from your stitches.  There is a bad smell coming from your wound. MAKE SURE YOU:  Understand these instructions.  Will watch your condition.  Will get help if you are not doing well or get worse. Document Released: 01/27/2009 Document Revised: 06/24/2011 Document Reviewed: 01/27/2009 [facility] Patient Information 2015 [place], [place]. This  information is not intended to replace advice given to you by your health care provider. Make sure you discuss any questions you have with your health care provider.

## 2013-10-27 NOTE — ED Notes (Signed)
Patients right index finger placed in splint and wrapped as per order. Patient tolerated well.

## 2013-10-28 NOTE — ED Provider Notes (Signed)
.  [facility] Medical screening examination/treatment/procedure(s) were performed by non-physician practitioner and as supervising physician I was immediately available for consultation/collaboration.   EKG Interpretation None        Benny LennertJoseph L Chigozie Basaldua, MD 10/28/13 1616

## 2014-10-02 ENCOUNTER — Encounter (HOSPITAL_BASED_OUTPATIENT_CLINIC_OR_DEPARTMENT_OTHER): Payer: Self-pay | Admitting: Emergency Medicine

## 2014-10-02 ENCOUNTER — Emergency Department (HOSPITAL_BASED_OUTPATIENT_CLINIC_OR_DEPARTMENT_OTHER)
Admission: EM | Admit: 2014-10-02 | Discharge: 2014-10-02 | Disposition: A | Discharge status: Home / Self Care | Payer: Managed Care, Other (non HMO) | Attending: Emergency Medicine | Admitting: Emergency Medicine

## 2014-10-02 DIAGNOSIS — L509 Urticaria, unspecified: Secondary | ICD-10-CM | POA: Diagnosis not present

## 2014-10-02 DIAGNOSIS — K219 Gastro-esophageal reflux disease without esophagitis: Secondary | ICD-10-CM | POA: Diagnosis not present

## 2014-10-02 DIAGNOSIS — J45909 Unspecified asthma, uncomplicated: Secondary | ICD-10-CM | POA: Diagnosis not present

## 2014-10-02 DIAGNOSIS — Z720 Tobacco use: Secondary | ICD-10-CM | POA: Insufficient documentation

## 2014-10-02 DIAGNOSIS — Z79899 Other long term (current) drug therapy: Secondary | ICD-10-CM | POA: Insufficient documentation

## 2014-10-02 DIAGNOSIS — R21 Rash and other nonspecific skin eruption: Secondary | ICD-10-CM | POA: Diagnosis present

## 2014-10-02 MED ORDER — PREDNISONE 10 MG PO TABS: 20.0000 mg | ORAL_TABLET | Freq: Two times a day (BID) | ORAL | Status: DC

## 2014-10-02 MED ORDER — DIPHENHYDRAMINE HCL 25 MG PO CAPS
50.0000 mg | ORAL_CAPSULE | Freq: Once | ORAL | Status: AC
  Administered 2014-10-02: 50 mg via ORAL
  Filled 2014-10-02: qty 2

## 2014-10-02 MED ORDER — PREDNISONE 20 MG PO TABS
40.0000 mg | ORAL_TABLET | Freq: Once | ORAL | Status: AC
  Administered 2014-10-02: 40 mg via ORAL
  Filled 2014-10-02: qty 2

## 2014-10-02 MED ORDER — EPINEPHRINE HCL 1 MG/ML IJ SOLN
0.3000 mg | Freq: Once | INTRAMUSCULAR | Status: AC
  Administered 2014-10-02: 0.3 mg via SUBCUTANEOUS
  Filled 2014-10-02: qty 1

## 2014-10-02 NOTE — Discharge Instructions (Signed)
Prednisone as prescribed.  Benadryl 50 mg every 6 hours as needed for itching.  Return to the ER for difficulty breathing, throat swelling, or other new and concerning symptoms.   Hives Hives are itchy, red, puffy (swollen) areas of the skin. Hives can change in size and location on your body. Hives can come and go for hours, days, or weeks. Hives do not spread from person to person (noncontagious). Scratching, exercise, and stress can make your hives worse. HOME CARE  Avoid things that cause your hives (triggers).  Take antihistamine medicines as told by your doctor. Do not drive while taking an antihistamine.  Take any other medicines for itching as told by your doctor.  Wear loose-fitting clothing.  Keep all doctor visits as told. GET HELP RIGHT AWAY IF:   You have a fever.  Your tongue or lips are puffy.  You have trouble breathing or swallowing.  You feel tightness in the throat or chest.  You have belly (abdominal) pain.  You have lasting or severe itching that is not helped by medicine.  You have painful or puffy joints. These problems may be the first sign of a life-threatening allergic reaction. Call your local emergency services (911 in [place] MAKE SURE YOU:   Understand these instructions.  Will watch your condition.  Will get help right away if you are not doing well or get worse. Document Released: 01/09/2008 Document Revised: 10/01/2011 Document Reviewed: 06/25/2011 [facility] Patient Information 2015 [place], [place]. This information is not intended to replace advice given to you by your health care provider. Make sure you discuss any questions you have with your health care provider.

## 2014-10-02 NOTE — ED Provider Notes (Signed)
CSN: [id number]     Arrival date & time 10/02/14  0448 History   First MD Initiated Contact with Patient 10/02/14 0539     Chief Complaint  Patient presents with  . Allergic Reaction     (Consider location/radiation/quality/duration/timing/severity/associated sxs/prior Treatment) HPI Comments: Patient is a 24 year old male with no significant past medical history. Presents for evaluation of rash. He woke up with blotchy, itchy areas all over. He was plowing a field earlier today, but this is nothing new for him and this is never happened before. He denies any new medications, foods, soaps, detergents, or other new exposures.  Patient is a 24 y.o. male presenting with allergic reaction. The history is provided by the patient.  Allergic Reaction Presenting symptoms: itching and rash   Presenting symptoms: no difficulty breathing and no difficulty swallowing   Severity:  Moderate Context: no medications and no new detergents/soaps   Relieved by:  Nothing Worsened by:  Nothing tried Ineffective treatments:  None tried   Past Medical History  Diagnosis Date  . Asthma   . GERD (gastroesophageal reflux disease)    History reviewed. No pertinent past surgical history. History reviewed. No pertinent family history. History  Substance Use Topics  . Smoking status: Current Every Day Smoker -- 0.50 packs/day    Types: Cigarettes  . Smokeless tobacco: Not on file  . Alcohol Use: Yes     Comment: occasional    Review of Systems  HENT: Negative for trouble swallowing.   Skin: Positive for itching and rash.  All other systems reviewed and are negative.     Allergies  Review of patient's allergies indicates no known allergies.  Home Medications   Prior to Admission medications   Medication Sig Start Date End Date Taking? Authorizing Provider  HYDROcodone-acetaminophen (NORCO/VICODIN) 5-325 MG per tablet Take 1 tablet by mouth every 4 (four) hours as needed. 10/27/13   Ivery Quale, PA-C  omeprazole (PRILOSEC) 20 MG capsule Take 20 mg by mouth daily.    Historical Provider, MD   BP 132/61 mmHg  Pulse 77  Temp(Src) 97.9 F (36.6 C) (Oral)  Resp 20  Ht 6\' 4"  (1.93 m)  Wt 253 lb (114.76 kg)  BMI 30.81 kg/m2  SpO2 100% Physical Exam  Constitutional: He is oriented to person, place, and time. He appears well-developed and well-nourished. No distress.  HENT:  Head: Normocephalic and atraumatic.  Neck: Normal range of motion. Neck supple.  Cardiovascular: Normal rate, regular rhythm and normal heart sounds.   No murmur heard. Pulmonary/Chest: Effort normal and breath sounds normal. No respiratory distress. He has no wheezes.  Abdominal: Soft. Bowel sounds are normal.  Musculoskeletal: Normal range of motion. He exhibits no edema.  Neurological: He is alert and oriented to person, place, and time.  Skin: Skin is warm and dry. He is not diaphoretic.  There is an urticarial rash present to the torso, arms, legs.  Nursing note and vitals reviewed.   ED Course  Procedures (including critical care time) Labs Review Labs Reviewed - No data to display  Imaging Review No results found.   EKG Interpretation None      MDM   Final diagnoses:  None    Rash is urticarial and is markedly improved with benadryl, prednisone, and epi.  Will discharge with prednisone, benadryl.    Geoffery Lyons, MD 10/02/14 [phone]

## 2014-10-02 NOTE — ED Notes (Signed)
Pt reports awoke from sleep with severe itching and rash generalized over body denies new food detergent or personal items states hx of seasonal allergies

## 2015-05-25 DIAGNOSIS — T781XXA Other adverse food reactions, not elsewhere classified, initial encounter: Secondary | ICD-10-CM

## 2015-05-25 HISTORY — DX: Other adverse food reactions, not elsewhere classified, initial encounter: T78.1XXA

## 2015-06-05 DIAGNOSIS — D72829 Elevated white blood cell count, unspecified: Secondary | ICD-10-CM | POA: Insufficient documentation

## 2015-06-05 DIAGNOSIS — R7989 Other specified abnormal findings of blood chemistry: Secondary | ICD-10-CM

## 2015-06-05 HISTORY — DX: Elevated white blood cell count, unspecified: D72.829

## 2015-06-05 HISTORY — DX: Other specified abnormal findings of blood chemistry: R79.89

## 2015-09-04 DIAGNOSIS — E785 Hyperlipidemia, unspecified: Secondary | ICD-10-CM

## 2015-09-04 HISTORY — DX: Hyperlipidemia, unspecified: E78.5

## 2015-11-18 ENCOUNTER — Emergency Department (HOSPITAL_COMMUNITY)
Admission: EM | Admit: 2015-11-18 | Discharge: 2015-11-18 | Disposition: A | Discharge status: Home / Self Care | Payer: Managed Care, Other (non HMO) | Attending: Emergency Medicine | Admitting: Emergency Medicine

## 2015-11-18 ENCOUNTER — Emergency Department (HOSPITAL_COMMUNITY): Payer: Managed Care, Other (non HMO)

## 2015-11-18 ENCOUNTER — Encounter (HOSPITAL_COMMUNITY): Payer: Self-pay | Admitting: Emergency Medicine

## 2015-11-18 DIAGNOSIS — S0990XA Unspecified injury of head, initial encounter: Secondary | ICD-10-CM

## 2015-11-18 DIAGNOSIS — W11XXXA Fall on and from ladder, initial encounter: Secondary | ICD-10-CM | POA: Insufficient documentation

## 2015-11-18 DIAGNOSIS — R51 Headache: Secondary | ICD-10-CM | POA: Diagnosis not present

## 2015-11-18 DIAGNOSIS — W19XXXA Unspecified fall, initial encounter: Secondary | ICD-10-CM

## 2015-11-18 DIAGNOSIS — T148XXA Other injury of unspecified body region, initial encounter: Secondary | ICD-10-CM

## 2015-11-18 DIAGNOSIS — Y999 Unspecified external cause status: Secondary | ICD-10-CM | POA: Diagnosis not present

## 2015-11-18 DIAGNOSIS — Y92009 Unspecified place in unspecified non-institutional (private) residence as the place of occurrence of the external cause: Secondary | ICD-10-CM | POA: Insufficient documentation

## 2015-11-18 DIAGNOSIS — Y9389 Activity, other specified: Secondary | ICD-10-CM | POA: Insufficient documentation

## 2015-11-18 DIAGNOSIS — Z79899 Other long term (current) drug therapy: Secondary | ICD-10-CM | POA: Insufficient documentation

## 2015-11-18 DIAGNOSIS — S79921A Unspecified injury of right thigh, initial encounter: Secondary | ICD-10-CM | POA: Diagnosis present

## 2015-11-18 DIAGNOSIS — S70311A Abrasion, right thigh, initial encounter: Secondary | ICD-10-CM | POA: Insufficient documentation

## 2015-11-18 DIAGNOSIS — F17210 Nicotine dependence, cigarettes, uncomplicated: Secondary | ICD-10-CM | POA: Diagnosis not present

## 2015-11-18 MED ORDER — IBUPROFEN 800 MG PO TABS: 800.0000 mg | ORAL_TABLET | Freq: Three times a day (TID) | ORAL | 0 refills | Status: DC | PRN

## 2015-11-18 NOTE — ED Notes (Signed)
Pt resting calmly w/ eyes closed. Rise & fall of the chest noted. Bed in low position, side rails up x2. NAD noted at this time. Woke pt so he could call his ride to come pick him up.

## 2015-11-18 NOTE — ED Provider Notes (Signed)
TIME SEEN: 5:00 AM  CHIEF COMPLAINT: Fall, head injury, left leg pain  HPI: Pt is a 25 y.o. male with history of asthma, GERD who presents emergency department after he fell at home today. Reports he drank a fifth of liquor tonight. States that he was walking around the out building outside of his house when he tripped and fell hitting his leg and head on a ladder. No loss of consciousness. He is not on antiplatelet, anticoagulation. Denies numbness, tingling, weakness. States he felt "woozy" and that is what prompted him to call 911. No nausea or vomiting. Complains of left-sided facial pain but no headache currently. Also complaining of an abrasion to the right inner thigh, left anterior shin. Is tender over the left shin and is concerned for fracture although he is ambulatory.  ROS: See HPI Constitutional: no fever  Eyes: no drainage  ENT: no runny nose   Cardiovascular:  no chest pain  Resp: no SOB  GI: no vomiting GU: no dysuria Integumentary: no rash  Allergy: no hives  Musculoskeletal: no leg swelling  Neurological: no slurred speech ROS otherwise negative  PAST MEDICAL HISTORY/PAST SURGICAL HISTORY:  Past Medical History:  Diagnosis Date  . Asthma   . GERD (gastroesophageal reflux disease)     MEDICATIONS:  Prior to Admission medications   Medication Sig Start Date End Date Taking? Authorizing Provider  HYDROcodone-acetaminophen (NORCO/VICODIN) 5-325 MG per tablet Take 1 tablet by mouth every 4 (four) hours as needed. 10/27/13   Ivery Quale, PA-C  omeprazole (PRILOSEC) 20 MG capsule Take 20 mg by mouth daily.    Historical Provider, MD  predniSONE (DELTASONE) 10 MG tablet Take 2 tablets (20 mg total) by mouth 2 (two) times daily. 10/02/14   Geoffery Lyons, MD    ALLERGIES:  Allergies  Allergen Reactions  . Shellfish Allergy Anaphylaxis    SOCIAL HISTORY:  Social History  Substance Use Topics  . Smoking status: Current Every Day Smoker    Packs/day: 0.50    Types:  Cigarettes  . Smokeless tobacco: Never Used  . Alcohol use Yes     Comment: occasional    FAMILY HISTORY: No family history on file.  EXAM: BP 115/81 (BP Location: Right Arm)   Pulse 89   Temp 98.1 F (36.7 C) (Oral)   Resp 19   Ht 6\' 3"  (1.905 m)   Wt 256 lb (116.1 kg)   SpO2 97%   BMI 32.00 kg/m  CONSTITUTIONAL: Alert and oriented and responds appropriately to questions. Well-appearing; well-nourished; GCS 15 HEAD: Normocephalic; Some ecchymosis and swelling around the left eye EYES: Conjunctivae clear, PERRL, EOMI, reports normal vision ENT: normal nose; no rhinorrhea; moist mucous membranes; pharynx without lesions noted; no dental injury; no septal hematoma NECK: Supple, no meningismus, no LAD; no midline spinal tenderness, step-off or deformity CARD: RRR; S1 and S2 appreciated; no murmurs, no clicks, no rubs, no gallops RESP: Normal chest excursion without splinting or tachypnea; breath sounds clear and equal bilaterally; no wheezes, no rhonchi, no rales; no hypoxia or respiratory distress CHEST:  chest wall stable, no crepitus or ecchymosis or deformity, nontender to palpation ABD/GI: Normal bowel sounds; non-distended; soft, non-tender, no rebound, no guarding PELVIS:  stable, nontender to palpation BACK:  The back appears normal and is non-tender to palpation, there is no CVA tenderness; no midline spinal tenderness, step-off or deformity EXT: Abrasions to the right inner thigh and left anterior shin. Tender to palpation over the left tibia without obvious deformity. Normal ROM  in all joints; otherwise extremities are non-tender to palpation; no edema; normal capillary refill; no cyanosis, no bony tenderness or bony deformity of patient's extremities, no joint effusion, no ecchymosis or lacerations, compartments are soft, 2+ DP pulses bilaterally, ambulatory in the emergency department without a limp or without assistance    SKIN: Normal color for age and race; warm NEURO:  Moves all extremities equally, sensation to light touch intact diffusely, cranial nerves II through XII intact PSYCH: The patient's mood and manner are appropriate. Grooming and personal hygiene are appropriate.  MEDICAL DECISION MAKING: Patient here with a fall on intoxicated. He doesn't appear significant intoxicated currently. He states he is very concerned because he felt "woozy". I suspect that he has had a concussion. He is tender to palpation of the left side of his face with bruising, swelling. Will obtain a CT of his head and face. Is as abrasions to bilateral lower extremity and is requesting an x-ray of the left tibia. Doubt fracture given he is ambulatory. He states his last tetanus vaccination was 1 year ago. Declines pain medication at this time.  ED PROGRESS: CT of the head, face and x-rays of the left tibia are all unremarkable. Discussed with him head injury return precautions. He is still neurologically intact. I feel he is safe to be discharged and take ibuprofen at home as needed for pain.  Discussed rest, elevation, ice with patient.   At this time, I do not feel there is any life-threatening condition present. I have reviewed and discussed all results (EKG, imaging, lab, urine as appropriate), exam findings with patient/family. I have reviewed nursing notes and appropriate previous records.  I feel the patient is safe to be discharged home without further emergent workup and can continue workup as an outpatient. Discussed usual and customary return precautions. Patient/family verbalize understanding and are comfortable with this plan.  Outpatient follow-up has been provided. All questions have been answered.      Layla Maw Ward, DO 11/18/15 [phone]

## 2015-11-18 NOTE — ED Notes (Signed)
Pt states he fell coming back into the house hitting his head on a ladder. Pt denies LOC. Abrasion to the right thigh cleaned w/ shur clens.

## 2015-11-18 NOTE — ED Notes (Signed)
Patient transported to X-ray 

## 2015-11-18 NOTE — ED Triage Notes (Signed)
Pt brought in by EMS after sustaining a fall at home. Pt went outside to use restroom and fell over a ladder on his way back. Pt admits to drinking a fifth of liquor today. Pt has abrasion to L. Shin area and knot above L. Eyebrow.

## 2015-11-18 NOTE — ED Notes (Signed)
Pt alert & oriented x4, stable gait. Patient given discharge instructions, paperwork & prescription(s). Patient  instructed to stop at the registration desk to finish any additional paperwork. Patient verbalized understanding. Pt left department w/ no further questions. 

## 2016-03-16 ENCOUNTER — Emergency Department (HOSPITAL_COMMUNITY)
Admission: EM | Admit: 2016-03-16 | Discharge: 2016-03-17 | Disposition: A | Discharge status: Home / Self Care | Payer: Managed Care, Other (non HMO) | Attending: Emergency Medicine | Admitting: Emergency Medicine

## 2016-03-16 ENCOUNTER — Encounter (HOSPITAL_COMMUNITY): Payer: Self-pay | Admitting: *Deleted

## 2016-03-16 DIAGNOSIS — S61216A Laceration without foreign body of right little finger without damage to nail, initial encounter: Secondary | ICD-10-CM | POA: Diagnosis not present

## 2016-03-16 DIAGNOSIS — Y999 Unspecified external cause status: Secondary | ICD-10-CM | POA: Diagnosis not present

## 2016-03-16 DIAGNOSIS — Y9389 Activity, other specified: Secondary | ICD-10-CM | POA: Diagnosis not present

## 2016-03-16 DIAGNOSIS — F17210 Nicotine dependence, cigarettes, uncomplicated: Secondary | ICD-10-CM | POA: Diagnosis not present

## 2016-03-16 DIAGNOSIS — J45909 Unspecified asthma, uncomplicated: Secondary | ICD-10-CM | POA: Insufficient documentation

## 2016-03-16 DIAGNOSIS — Y929 Unspecified place or not applicable: Secondary | ICD-10-CM | POA: Diagnosis not present

## 2016-03-16 DIAGNOSIS — Z79899 Other long term (current) drug therapy: Secondary | ICD-10-CM | POA: Insufficient documentation

## 2016-03-16 DIAGNOSIS — S61217A Laceration without foreign body of left little finger without damage to nail, initial encounter: Secondary | ICD-10-CM

## 2016-03-16 DIAGNOSIS — W260XXA Contact with knife, initial encounter: Secondary | ICD-10-CM | POA: Diagnosis not present

## 2016-03-16 NOTE — ED Triage Notes (Signed)
Pt was skinning a deer when the knife slipped cutting left pinkie finger, tetanus was 6 months ago,

## 2016-03-17 ENCOUNTER — Emergency Department (HOSPITAL_BASED_OUTPATIENT_CLINIC_OR_DEPARTMENT_OTHER)
Admission: EM | Admit: 2016-03-17 | Discharge: 2016-03-17 | Disposition: A | Discharge status: Home / Self Care | Payer: Managed Care, Other (non HMO) | Source: Home / Self Care | Attending: Emergency Medicine | Admitting: Emergency Medicine

## 2016-03-17 ENCOUNTER — Encounter (HOSPITAL_BASED_OUTPATIENT_CLINIC_OR_DEPARTMENT_OTHER): Payer: Self-pay | Admitting: *Deleted

## 2016-03-17 DIAGNOSIS — J45909 Unspecified asthma, uncomplicated: Secondary | ICD-10-CM

## 2016-03-17 DIAGNOSIS — Y9389 Activity, other specified: Secondary | ICD-10-CM

## 2016-03-17 DIAGNOSIS — W268XXA Contact with other sharp object(s), not elsewhere classified, initial encounter: Secondary | ICD-10-CM

## 2016-03-17 DIAGNOSIS — S61210A Laceration without foreign body of right index finger without damage to nail, initial encounter: Secondary | ICD-10-CM | POA: Insufficient documentation

## 2016-03-17 DIAGNOSIS — Y999 Unspecified external cause status: Secondary | ICD-10-CM

## 2016-03-17 DIAGNOSIS — F17210 Nicotine dependence, cigarettes, uncomplicated: Secondary | ICD-10-CM

## 2016-03-17 DIAGNOSIS — Y929 Unspecified place or not applicable: Secondary | ICD-10-CM

## 2016-03-17 MED ORDER — LIDOCAINE HCL 2 % IJ SOLN
5.0000 mL | Freq: Once | INTRAMUSCULAR | Status: AC
  Administered 2016-03-17: 5 mL
  Filled 2016-03-17: qty 20

## 2016-03-17 NOTE — ED Notes (Signed)
ED Provider at bedside. 

## 2016-03-17 NOTE — Discharge Instructions (Signed)
The laceration to your finger was repaired with Dermabond. This will come off in about 5 to 7 days. Please keep wound clean and dry. See your MD or return to the ED if any signs of infection.

## 2016-03-17 NOTE — ED Triage Notes (Signed)
Pt states that he was skinning a deer and he cut his right index finger on the posterior aspect. approx one inch in length. Bleeding controlled.

## 2016-03-17 NOTE — ED Notes (Signed)
MD at bedside to suture.

## 2016-03-17 NOTE — ED Provider Notes (Signed)
[facility]Y DEPT Provider Note   CSN: [id number] Arrival date & time: 03/16/16  2335     History   Chief Complaint Chief Complaint  Patient presents with  . Laceration    HPI Tracy SalisburyJoshua Schroeder is a 25 y.o. male.  Patient is a 25 year old male who presents to the emergency department with a complaint of laceration to the left little finger.  Patient states he was skinning a deer when he accidentally cut his left little finger. The patient states he has good range of motion of the little finger. He states his last tetanus was about 6 months ago. He denies being on any anticoagulation medications, and he denies any history of bleeding disorders. There's been no previous operations or procedures involving the left hand. The patient presents now for assistance with his laceration.   The history is provided by the patient.  Laceration      Past Medical History:  Diagnosis Date  . Asthma   . GERD (gastroesophageal reflux disease)     There are no active problems to display for this patient.   History reviewed. No pertinent surgical history.     Home Medications    Prior to Admission medications   Medication Sig Start Date End Date Taking? Authorizing Provider  pantoprazole (PROTONIX) 40 MG tablet Take 40 mg by mouth 2 (two) times daily.   Yes Historical Provider, MD  ibuprofen (ADVIL,MOTRIN) 800 MG tablet Take 1 tablet (800 mg total) by mouth every 8 (eight) hours as needed for mild pain. 11/18/15   Kristen N Ward, DO  omeprazole (PRILOSEC) 20 MG capsule Take 20 mg by mouth daily.    Historical Provider, MD    Family History No family history on file.  Social History Social History  Substance Use Topics  . Smoking status: Current Every Day Smoker    Packs/day: 0.50    Types: Cigarettes  . Smokeless tobacco: Never Used  . Alcohol use Yes     Comment: occasional     Allergies   Shellfish allergy   Review of Systems Review of Systems  Constitutional:  Negative for activity change.       All ROS Neg except as noted in HPI  HENT: Negative for nosebleeds.   Eyes: Negative for photophobia and discharge.  Respiratory: Negative for cough, shortness of breath and wheezing.   Cardiovascular: Negative for chest pain and palpitations.  Gastrointestinal: Negative for abdominal pain and blood in stool.  Genitourinary: Negative for dysuria, frequency and hematuria.  Musculoskeletal: Negative for arthralgias, back pain and neck pain.  Skin: Negative.   Neurological: Negative for dizziness, seizures and speech difficulty.  Psychiatric/Behavioral: Negative for confusion and hallucinations.     Physical Exam Updated Vital Signs BP 125/72   Pulse 76   Temp 97.8 F (36.6 C)   Resp 16   Ht 6\' 4"  (1.93 m)   Wt 115.2 kg   SpO2 98%   BMI 30.92 kg/m   Physical Exam  Constitutional: He is oriented to person, place, and time. He appears well-developed and well-nourished.  Non-toxic appearance.  HENT:  Head: Normocephalic.  Right Ear: Tympanic membrane and external ear normal.  Left Ear: Tympanic membrane and external ear normal.  Eyes: EOM and lids are normal. Pupils are equal, round, and reactive to light.  Neck: Normal range of motion. Neck supple. Carotid bruit is not present.  Cardiovascular: Normal rate, regular rhythm, normal heart sounds, intact distal pulses and normal pulses.   Pulmonary/Chest: Breath sounds normal.  No respiratory distress.  Abdominal: Soft. Bowel sounds are normal. There is no tenderness. There is no guarding.  Musculoskeletal: Normal range of motion.       Left hand: He exhibits laceration.       Hands: Pt has a 1.4 cm laceration of the left little finger. Bleeding controlled. Pt has full ROM of the finger. No bone or tendon involvement.  Lymphadenopathy:       Head (right side): No submandibular adenopathy present.       Head (left side): No submandibular adenopathy present.    He has no cervical adenopathy.    Neurological: He is alert and oriented to person, place, and time. He has normal strength. No cranial nerve deficit or sensory deficit.  Skin: Skin is warm and dry.  Psychiatric: He has a normal mood and affect. His speech is normal.  Nursing note and vitals reviewed.    ED Treatments / Results  Labs (all labs ordered are listed, but only abnormal results are displayed) Labs Reviewed - No data to display  EKG  EKG Interpretation None       Radiology No results found.  Procedures .Marland Kitchen.Laceration Repair Date/Time: 03/17/2016 12:23 AM Performed by: Ivery QualeBRYANT, Innocence Schlotzhauer Authorized by: Ivery QualeBRYANT, Kynlea Blackston   Consent:    Consent obtained:  Verbal   Consent given by:  Patient   Risks discussed:  Infection and poor wound healing Anesthesia (see MAR for exact dosages):    Anesthesia method:  None Laceration details:    Location:  Finger   Finger location:  L small finger   Length (cm):  1.4 Repair type:    Repair type:  Simple Pre-procedure details:    Preparation:  Patient was prepped and draped in usual sterile fashion Exploration:    Hemostasis achieved with:  Direct pressure   Wound exploration: wound explored through full range of motion     Wound extent: no nerve damage noted and no tendon damage noted     Contaminated: no   Treatment:    Area cleansed with:  Saline   Amount of cleaning:  Standard   Irrigation solution:  Sterile saline Skin repair:    Repair method:  Tissue adhesive Approximation:    Approximation:  Close Post-procedure details:    Dressing:  Open (no dressing)   Patient tolerance of procedure:  Tolerated well, no immediate complications   (including critical care time)  Medications Ordered in ED Medications - No data to display   Initial Impression / Assessment and Plan / ED Course  I have reviewed the triage vital signs and the nursing notes.  Pertinent labs & imaging results that were available during my care of the patient were reviewed by me  and considered in my medical decision making (see chart for details).  Clinical Course     **I have reviewed nursing notes, vital signs, and all appropriate lab and imaging results for this patient.*  Final Clinical Impressions(s) / ED Diagnoses  Laceration to the left fifth finger was repaired with dermabond. Pt advised to return to the ED if any signs of infection or problem. Pt in agreement with plan. Last tetanus was 6 months ago.   Final diagnoses:  None    New Prescriptions New Prescriptions   No medications on file     Ivery QualeHobson Janson Lamar, PA-C 03/17/16 0026    Glynn OctaveStephen Rancour, MD 03/17/16 [phone]

## 2016-03-17 NOTE — Discharge Instructions (Signed)
Stitches need to be removed in ten days. That can be done at your doctor's office, at an urgent care center, or in the ED.

## 2016-03-17 NOTE — ED Notes (Signed)
MD with pt  

## 2016-03-17 NOTE — ED Provider Notes (Signed)
[facility]   CSN: [id number] Arrival date & time: 03/17/16  0301     History   Chief Complaint Chief Complaint  Patient presents with  . finger laceration    HPI Tracy Schroeder is a 25 y.o. male.  He was skinning a tear when he accidentally cut his right second finger. He is up-to-date on tetanus immunizations. Of note, he had cut his left fifth finger yesterday and is similar accident.   The history is provided by the patient.    Past Medical History:  Diagnosis Date  . Asthma   . GERD (gastroesophageal reflux disease)     There are no active problems to display for this patient.   History reviewed. No pertinent surgical history.     Home Medications    Prior to Admission medications   Medication Sig Start Date End Date Taking? Authorizing Provider  ibuprofen (ADVIL,MOTRIN) 800 MG tablet Take 1 tablet (800 mg total) by mouth every 8 (eight) hours as needed for mild pain. 11/18/15   Kristen N Ward, DO  omeprazole (PRILOSEC) 20 MG capsule Take 20 mg by mouth daily.    Historical Provider, MD  pantoprazole (PROTONIX) 40 MG tablet Take 40 mg by mouth 2 (two) times daily.    Historical Provider, MD    Family History No family history on file.  Social History Social History  Substance Use Topics  . Smoking status: Current Every Day Smoker    Packs/day: 0.50    Types: Cigarettes  . Smokeless tobacco: Never Used  . Alcohol use Yes     Comment: occasional     Allergies   Shellfish allergy   Review of Systems Review of Systems  All other systems reviewed and are negative.    Physical Exam Updated Vital Signs BP 112/59 (BP Location: Left Arm)   Pulse 88   Temp 98.3 F (36.8 C) (Oral)   Resp 20   SpO2 97%   Physical Exam  Nursing note and vitals reviewed.  25 year old male, resting comfortably and in no acute distress. Vital signs are Normal. Oxygen saturation is 97%, which is normal. Head is normocephalic and  atraumatic. PERRLA, EOMI. Oropharynx is clear. Neck is nontender and supple without adenopathy or JVD. Back is nontender and there is no CVA tenderness. Lungs are clear without rales, wheezes, or rhonchi. Chest is nontender. Heart has regular rate and rhythm without murmur. Abdomen is soft, flat, nontender without masses or hepatosplenomegaly and peristalsis is normoactive. Extremities: 2.5 cm laceration is present just distal to the PIP joint of the right second finger. There is normal sensation distal to the laceration, and extensor tendon function is normal. Left fifth finger has a dressing in place for laceration of the distal phalanx dorsally-this is not removed. Skin is warm and dry without rash. Neurologic: Mental status is normal, cranial nerves are intact, there are no motor or sensory deficits.  ED Treatments / Results   Procedures Procedures (including critical care time) LACERATION REPAIR Performed by: KGMWN,UUVOZGLICK,Garold Sheeler Authorized by: DGUYQ,IHKVQGLICK,Lorry Anastasi Consent: Verbal consent obtained. Risks and benefits: risks, benefits and alternatives were discussed Consent given by: patient Patient identity confirmed: provided demographic data Prepped and Draped in normal sterile fashion Wound explored  Laceration Location: Right second finger  Laceration Length: 2.5 cm  No Foreign Bodies seen or palpated  Anesthesia: local infiltration  Local anesthetic: lidocaine 2% without epinephrine  Anesthetic total: 1.5 ml  Amount of cleaning: standard  Skin closure: Close  Number of sutures: 5  Technique: Simple interrupted suturing with 4-0 nylon   Patient tolerance: Patient tolerated the procedure well with no immediate complications.   Medications Ordered in ED Medications  lidocaine (XYLOCAINE) 2 % (with pres) injection 100 mg (5 mLs Infiltration Given by Other 03/17/16 0405)     Initial Impression / Assessment and Plan / ED Course  I have reviewed the triage vital signs and the  nursing notes.  Clinical Course    Laceration of the right second finger closed with sutures. Recommended suture removal in 10 days. Old records are reviewed confirming laceration of the left fifth finger [facility].  Final Clinical Impressions(s) / ED Diagnoses   Final diagnoses:  Laceration of right index finger, initial encounter    New Prescriptions New Prescriptions   No medications on file     Dione Boozeavid Edelmiro Innocent, MD 03/17/16 0410

## 2016-03-17 NOTE — ED Notes (Signed)
Wound cleaned and antibiotic ointment placed. bandaid placed to finger.

## 2016-10-01 ENCOUNTER — Encounter (HOSPITAL_BASED_OUTPATIENT_CLINIC_OR_DEPARTMENT_OTHER): Payer: Self-pay | Admitting: Emergency Medicine

## 2016-10-01 ENCOUNTER — Emergency Department (HOSPITAL_BASED_OUTPATIENT_CLINIC_OR_DEPARTMENT_OTHER)
Admission: EM | Admit: 2016-10-01 | Discharge: 2016-10-01 | Disposition: A | Discharge status: Home / Self Care | Payer: Worker's Compensation | Attending: Physician Assistant | Admitting: Physician Assistant

## 2016-10-01 ENCOUNTER — Emergency Department (HOSPITAL_BASED_OUTPATIENT_CLINIC_OR_DEPARTMENT_OTHER): Payer: Worker's Compensation

## 2016-10-01 DIAGNOSIS — Y999 Unspecified external cause status: Secondary | ICD-10-CM | POA: Insufficient documentation

## 2016-10-01 DIAGNOSIS — J45909 Unspecified asthma, uncomplicated: Secondary | ICD-10-CM | POA: Insufficient documentation

## 2016-10-01 DIAGNOSIS — Y939 Activity, unspecified: Secondary | ICD-10-CM | POA: Insufficient documentation

## 2016-10-01 DIAGNOSIS — Z79899 Other long term (current) drug therapy: Secondary | ICD-10-CM | POA: Insufficient documentation

## 2016-10-01 DIAGNOSIS — F17210 Nicotine dependence, cigarettes, uncomplicated: Secondary | ICD-10-CM | POA: Insufficient documentation

## 2016-10-01 DIAGNOSIS — S20319A Abrasion of unspecified front wall of thorax, initial encounter: Secondary | ICD-10-CM | POA: Insufficient documentation

## 2016-10-01 DIAGNOSIS — W268XXA Contact with other sharp object(s), not elsewhere classified, initial encounter: Secondary | ICD-10-CM | POA: Insufficient documentation

## 2016-10-01 DIAGNOSIS — S299XXA Unspecified injury of thorax, initial encounter: Secondary | ICD-10-CM

## 2016-10-01 DIAGNOSIS — Y929 Unspecified place or not applicable: Secondary | ICD-10-CM | POA: Insufficient documentation

## 2016-10-01 MED ORDER — AEROCHAMBER PLUS FLO-VU MEDIUM MISC
1.0000 | Freq: Once | Status: AC
  Administered 2016-10-01: 1
  Filled 2016-10-01: qty 1

## 2016-10-01 MED ORDER — IPRATROPIUM-ALBUTEROL 0.5-2.5 (3) MG/3ML IN SOLN
3.0000 mL | Freq: Once | RESPIRATORY_TRACT | Status: AC
  Administered 2016-10-01: 3 mL via RESPIRATORY_TRACT
  Filled 2016-10-01: qty 3

## 2016-10-01 MED ORDER — ALBUTEROL SULFATE HFA 108 (90 BASE) MCG/ACT IN AERS
1.0000 | INHALATION_SPRAY | Freq: Once | RESPIRATORY_TRACT | Status: AC
Start: 2016-10-01 — End: 2016-10-01
  Administered 2016-10-01: 2 via RESPIRATORY_TRACT
  Filled 2016-10-01: qty 6.7

## 2016-10-01 NOTE — Discharge Instructions (Signed)
Please watch out for signs pneumonia. Please decrease the amount of your smoking. And uses inhaler as needed to help with wheezing.

## 2016-10-01 NOTE — ED Triage Notes (Signed)
Pt reports getting hit in the chest by a rotary lawn mower blade moving at a very high speed. Pt has abrasion noted to center of chest and redness. Reports mild SOB

## 2016-10-01 NOTE — ED Provider Notes (Signed)
[facility]   CSN: [id number] Arrival date & time: 10/01/16  1755   By signing my name below, I, Clarisse Gouge, attest that this documentation has been prepared under the direction and in the presence of Silas Sedam, Cindee Salt, MD. Electronically signed, Clarisse Gouge, ED Scribe. 10/01/16. 6:57 PM.   History   Chief Complaint Chief Complaint  Patient presents with  . Chest Injury   The history is provided by the patient and medical records. No language interpreter was used.    Javares Kaufhold is a 26 y.o. male with h/o asthma presenting to the Emergency Department concerning chest pain d/t an injury he sustained PTA. Triage notes SOB as well; pt does not Pt reportedly hit in the central chest with a rotary lown mower blade at high speeds. Superficial linear abrasion noted to L chest. Pt presents the object that struck him: 8 inches of metal with sharp pointed edges. Tobacco use noted. Tetanus UTD. No arthralgias noted. No other complaints at this time.   Past Medical History:  Diagnosis Date  . Asthma   . GERD (gastroesophageal reflux disease)     There are no active problems to display for this patient.   History reviewed. No pertinent surgical history.     Home Medications    Prior to Admission medications   Medication Sig Start Date End Date Taking? Authorizing Provider  omeprazole (PRILOSEC) 20 MG capsule Take 20 mg by mouth daily.   Yes [provider]  pantoprazole (PROTONIX) 40 MG tablet Take 40 mg by mouth 2 (two) times daily.    [provider]    Family History No family history on file.  Social History Social History  Substance Use Topics  . Smoking status: Current Every Day Smoker    Packs/day: 0.50    Types: Cigarettes  . Smokeless tobacco: Never Used  . Alcohol use Yes     Comment: occasional     Allergies   Shellfish allergy   Review of Systems Review of Systems  Constitutional: Negative for  fever.  Musculoskeletal: Negative for arthralgias.  Skin: Positive for wound. Negative for color change and rash.  All other systems reviewed and are negative.    Physical Exam Updated Vital Signs SpO2 100%   Physical Exam  Constitutional: He is oriented to person, place, and time. He appears well-developed and well-nourished.  HENT:  Head: Normocephalic.  Eyes: Conjunctivae and EOM are normal. Pupils are equal, round, and reactive to light.  Neck: Normal range of motion.  Cardiovascular: Normal rate, regular rhythm and normal heart sounds.   Pulmonary/Chest: Effort normal. He has wheezes (scattered).  10 inch abrasion to chest wall. no pain with squeezing chest wall. Scattered wheezes.  Abdominal: He exhibits no distension.  Musculoskeletal: Normal range of motion. He exhibits no tenderness.  Neurological: He is alert and oriented to person, place, and time.  Skin: Skin is warm.  Psychiatric: He has a normal mood and affect.  Nursing note and vitals reviewed.    ED Treatments / Results  DIAGNOSTIC STUDIES: Oxygen Saturation is 100% on RA, by my interpretation.  COORDINATION OF CARE: 6:46 PM-Discussed next steps with pt. Pt verbalized understanding and is agreeable with the plan.   Labs (all labs ordered are listed, but only abnormal results are displayed) Labs Reviewed - No data to display  EKG  EKG Interpretation None       Radiology Dg Chest 2 View  Result Date: 10/01/2016 CLINICAL DATA:  Chest  injury. EXAM: CHEST  2 VIEW COMPARISON:  Radiographs of May 02, 2013. FINDINGS: The heart size and mediastinal contours are within normal limits. Both lungs are clear. No pneumothorax or pleural effusion is noted. The visualized skeletal structures are unremarkable. IMPRESSION: No active cardiopulmonary disease. Electronically Signed   By: Lupita RaiderJames  Green Jr, M.D.   On: 10/01/2016 18:22    Procedures Procedures (including critical care time)  Medications Ordered in  ED Medications  ipratropium-albuterol (DUONEB) 0.5-2.5 (3) MG/3ML nebulizer solution 3 mL (3 mLs Nebulization Given 10/01/16 1900)     Initial Impression / Assessment and Plan / ED Course  I have reviewed the triage vital signs and the nursing notes.  Pertinent labs & imaging results that were available during my care of the patient were reviewed by me and considered in my medical decision making (see chart for details).     I personally performed the services described in this documentation, which was scribed in my presence. The recorded information has been reviewed and is accurate.   Patient is a 26 year old male presenting after being struck in the chest by a metal object. Patient does not have any tenderness on palpation. Equal breath sounds. It is a superficial abrasion. Patient does have scattered wheezes, a patient is a heavy smoker suspect this is why. We'll give him a DuoNeb here. We'll encourage him take deep breaths get a incentive spirometer. Patient warned about precautions about developing pneumonia with chest wall injuries. Chest x-rays negative we'll have him follow-up with any concerns.  Final Clinical Impressions(s) / ED Diagnoses   Final diagnoses:  None    New Prescriptions New Prescriptions   No medications on file     Abelino DerrickMackuen, Kensli Bowley Lyn, MD 10/01/16 1903

## 2017-02-19 ENCOUNTER — Other Ambulatory Visit: Payer: Self-pay

## 2017-02-19 ENCOUNTER — Emergency Department (HOSPITAL_COMMUNITY)
Admission: EM | Admit: 2017-02-19 | Discharge: 2017-02-20 | Disposition: A | Discharge status: Home / Self Care | Payer: Self-pay | Attending: Emergency Medicine | Admitting: Emergency Medicine

## 2017-02-19 ENCOUNTER — Encounter (HOSPITAL_COMMUNITY): Payer: Self-pay

## 2017-02-19 DIAGNOSIS — Y929 Unspecified place or not applicable: Secondary | ICD-10-CM | POA: Insufficient documentation

## 2017-02-19 DIAGNOSIS — S20311A Abrasion of right front wall of thorax, initial encounter: Secondary | ICD-10-CM | POA: Insufficient documentation

## 2017-02-19 DIAGNOSIS — Y9389 Activity, other specified: Secondary | ICD-10-CM | POA: Insufficient documentation

## 2017-02-19 DIAGNOSIS — Y998 Other external cause status: Secondary | ICD-10-CM | POA: Insufficient documentation

## 2017-02-19 DIAGNOSIS — W01198A Fall on same level from slipping, tripping and stumbling with subsequent striking against other object, initial encounter: Secondary | ICD-10-CM | POA: Insufficient documentation

## 2017-02-19 DIAGNOSIS — F17210 Nicotine dependence, cigarettes, uncomplicated: Secondary | ICD-10-CM | POA: Insufficient documentation

## 2017-02-19 DIAGNOSIS — W010XXA Fall on same level from slipping, tripping and stumbling without subsequent striking against object, initial encounter: Secondary | ICD-10-CM

## 2017-02-19 DIAGNOSIS — J45909 Unspecified asthma, uncomplicated: Secondary | ICD-10-CM | POA: Insufficient documentation

## 2017-02-19 DIAGNOSIS — Z79899 Other long term (current) drug therapy: Secondary | ICD-10-CM | POA: Insufficient documentation

## 2017-02-19 DIAGNOSIS — S20211A Contusion of right front wall of thorax, initial encounter: Secondary | ICD-10-CM | POA: Insufficient documentation

## 2017-02-19 MED ORDER — KETOROLAC TROMETHAMINE 30 MG/ML IJ SOLN
30.0000 mg | Freq: Once | INTRAMUSCULAR | Status: AC
  Administered 2017-02-20: 30 mg via INTRAVENOUS
  Filled 2017-02-19: qty 1

## 2017-02-19 NOTE — ED Triage Notes (Signed)
Pt. Arrives via EMS. Pt. Tripped over his dog and fell into a metal poll. Pt. Having no labored breathing. Pt. Slightly deminished on right side per EMS.

## 2017-02-19 NOTE — ED Provider Notes (Signed)
[facility]   CSN: [id number] Arrival date & time: 02/19/17  2326     History   Chief Complaint Chief Complaint  Patient presents with  . Fall    HPI Tracy Schroeder is a 26 y.o. male.  The history is provided by the patient.  He tripped over his dog and fell striking his right side of his chest on a tree stump.  He is complaining of pain at 8/10.  Pain is worse with movement, palpation, and deep breathing.  He rates pain at 8/10.  He denies other injury.  Past Medical History:  Diagnosis Date  . Asthma   . GERD (gastroesophageal reflux disease)     There are no active problems to display for this patient.   History reviewed. No pertinent surgical history.     Home Medications    Prior to Admission medications   Medication Sig Start Date End Date Taking? Authorizing Provider  omeprazole (PRILOSEC) 20 MG capsule Take 20 mg by mouth daily.    [provider]  pantoprazole (PROTONIX) 40 MG tablet Take 40 mg by mouth 2 (two) times daily.    [provider]    Family History History reviewed. No pertinent family history.  Social History Social History   Tobacco Use  . Smoking status: Current Every Day Smoker    Packs/day: 0.50    Types: Cigarettes  . Smokeless tobacco: Never Used  Substance Use Topics  . Alcohol use: Yes    Comment: occasional  . Drug use: No     Allergies   Shellfish allergy   Review of Systems Review of Systems  All other systems reviewed and are negative.    Physical Exam Updated Vital Signs BP 138/90   Pulse 98   Ht 6\' 4"  (1.93 m)   Wt 114.8 kg (253 lb)   SpO2 94%   BMI 30.80 kg/m   Physical Exam  Nursing note and vitals reviewed.  26 year old male, resting comfortably and in no acute distress. Vital signs are normal. Oxygen saturation is 94%, which is normal. Head is normocephalic and atraumatic. PERRLA, EOMI. Oropharynx is clear. Neck is  nontender and supple without adenopathy or JVD. Back is nontender and there is no CVA tenderness. Lungs are clear without rales, wheezes, or rhonchi. Chest: Abrasion is present over the right lateral chest wall with tenderness in the same area.  There is no crepitus. Heart has regular rate and rhythm without murmur. Abdomen is soft, flat, nontender without masses or hepatosplenomegaly and peristalsis is normoactive. Extremities have no cyanosis or edema, full range of motion is present. Skin is warm and dry without rash. Neurologic: Mental status is normal, cranial nerves are intact, there are no motor or sensory deficits.  ED Treatments / Results  Labs (all labs ordered are listed, but only abnormal results are displayed) Labs Reviewed - No data to display  EKG  EKG Interpretation None       Radiology Dg Ribs Unilateral W/chest Right  Result Date: 02/20/2017 CLINICAL DATA:  Trip and fall injury with labored breathing. EXAM: RIGHT RIBS AND CHEST - 3+ VIEW COMPARISON:  Chest 10/01/2016 FINDINGS: Normal heart size and pulmonary vascularity. No focal airspace disease or consolidation in the lungs. No blunting of costophrenic angles. No pneumothorax. Mediastinal contours appear intact. The right ribs appear intact. No acute displaced fractures or focal bone lesions identified. No bone destruction. IMPRESSION: No evidence of active pulmonary disease.  Negative  right ribs. Electronically Signed   By: Burman NievesWilliam  Stevens M.D.   On: 02/20/2017 00:36    Procedures Procedures (including critical care time)  Medications Ordered in ED Medications  acetaminophen (TYLENOL) tablet 650 mg (not administered)  ketorolac (TORADOL) 30 MG/ML injection 30 mg (30 mg Intravenous Given 02/20/17 0014)     Initial Impression / Assessment and Plan / ED Course  I have reviewed the triage vital signs and the nursing notes.  Pertinent labs & imaging results that were available during my care of the patient  were reviewed by me and considered in my medical decision making (see chart for details).  Fall with blunt chest trauma.  Old records reviewed, and he has several prior ED visits related to falls and trauma.  He is sent for x-rays.  Is given ketorolac for pain.  He states explicitly that he does not want any narcotic analgesics.   X-rays are negative for fracture.  He had minimal relief of pain with ketorolac.  He is given a dose of acetaminophen.  Discharged with instructions on applying ice, using combination of ibuprofen and acetaminophen for pain control.  Follow-up with PCP as needed.  Final Clinical Impressions(s) / ED Diagnoses   Final diagnoses:  Fall from slip, trip, or stumble, initial encounter  Abrasion of right chest wall, initial encounter  Contusion of right chest wall, initial encounter    ED Discharge Orders    None       Dione BoozeGlick, Bralynn Velador, MD 02/20/17 0139

## 2017-02-20 ENCOUNTER — Emergency Department (HOSPITAL_COMMUNITY): Payer: Self-pay

## 2017-02-20 MED ORDER — ACETAMINOPHEN 325 MG PO TABS
650.0000 mg | ORAL_TABLET | Freq: Once | ORAL | Status: AC
  Administered 2017-02-20: 650 mg via ORAL
  Filled 2017-02-20: qty 2

## 2017-02-20 NOTE — ED Notes (Signed)
Pt. To XRAY via stretcher. 

## 2017-02-20 NOTE — Discharge Instructions (Signed)
Take ibuprofen and/or acetaminophen as needed for pain.  Apply ice several times a day. Keep it on for 20-30 minutes at a time.

## 2017-04-14 ENCOUNTER — Emergency Department (HOSPITAL_COMMUNITY)
Admission: EM | Admit: 2017-04-14 | Discharge: 2017-04-15 | Disposition: A | Discharge status: Home / Self Care | Payer: Self-pay | Attending: Emergency Medicine | Admitting: Emergency Medicine

## 2017-04-14 ENCOUNTER — Encounter (HOSPITAL_COMMUNITY): Payer: Self-pay | Admitting: Emergency Medicine

## 2017-04-14 DIAGNOSIS — Z79899 Other long term (current) drug therapy: Secondary | ICD-10-CM | POA: Insufficient documentation

## 2017-04-14 DIAGNOSIS — F419 Anxiety disorder, unspecified: Secondary | ICD-10-CM | POA: Insufficient documentation

## 2017-04-14 DIAGNOSIS — J45909 Unspecified asthma, uncomplicated: Secondary | ICD-10-CM | POA: Insufficient documentation

## 2017-04-14 DIAGNOSIS — F10920 Alcohol use, unspecified with intoxication, uncomplicated: Secondary | ICD-10-CM | POA: Insufficient documentation

## 2017-04-14 DIAGNOSIS — Z046 Encounter for general psychiatric examination, requested by authority: Secondary | ICD-10-CM | POA: Insufficient documentation

## 2017-04-14 DIAGNOSIS — F329 Major depressive disorder, single episode, unspecified: Secondary | ICD-10-CM | POA: Insufficient documentation

## 2017-04-14 DIAGNOSIS — F4329 Adjustment disorder with other symptoms: Secondary | ICD-10-CM

## 2017-04-14 DIAGNOSIS — R45851 Suicidal ideations: Secondary | ICD-10-CM | POA: Insufficient documentation

## 2017-04-14 DIAGNOSIS — F17210 Nicotine dependence, cigarettes, uncomplicated: Secondary | ICD-10-CM | POA: Insufficient documentation

## 2017-04-14 HISTORY — DX: Panic disorder (episodic paroxysmal anxiety): F41.0

## 2017-04-14 LAB — CBC
HEMATOCRIT: 50.7 % (ref 39.0–52.0)
Hemoglobin: 17.6 g/dL — ABNORMAL HIGH (ref 13.0–17.0)
MCH: 32.5 pg (ref 26.0–34.0)
MCHC: 34.7 g/dL (ref 30.0–36.0)
MCV: 93.7 fL (ref 78.0–100.0)
PLATELETS: 322 10*3/uL (ref 150–400)
RBC: 5.41 MIL/uL (ref 4.22–5.81)
RDW: 12.7 % (ref 11.5–15.5)
WBC: 13.9 10*3/uL — AB (ref 4.0–10.5)

## 2017-04-14 NOTE — ED Triage Notes (Signed)
Pt here because he wants to harm his self. Denies having any plan to do so. States his wife recently left him and took the kids with him and that he feels "worthless".

## 2017-04-14 NOTE — ED Provider Notes (Signed)
[facility] EMERGENCY DEPARTMENT Provider Note   CSN: [id number] Arrival date & time: 04/14/17  2324     History   Chief Complaint Chief Complaint  Patient presents with  . V70.1    HPI Tracy Schroeder is a 26 y.o. male.  Patient presents via police under voluntary commitment with suicidal thoughts and wanting to harm himself.  States he has had these thoughts for the past several days.  Does not have any plan to hurt himself.  He does not take any medication for depression or anxiety.  Admits to drinking about 7 alcoholic drinks tonight.  Does not drink every day.  Denies any other drug use.  States he is upset because his wife recently left him and took the children with him.  He has had good appetite and eating and drinking well.   The history is provided by the patient.    Past Medical History:  Diagnosis Date  . Asthma   . GERD (gastroesophageal reflux disease)   . Panic attacks     There are no active problems to display for this patient.   History reviewed. No pertinent surgical history.     Home Medications    Prior to Admission medications   Medication Sig Start Date End Date Taking? Authorizing Provider  loratadine (CLARITIN) 10 MG tablet Take 10 mg by mouth daily.   Yes [provider]  omeprazole (PRILOSEC) 20 MG capsule Take 20 mg by mouth daily.   Yes [provider]  pantoprazole (PROTONIX) 40 MG tablet Take 40 mg by mouth 2 (two) times daily.   Yes [provider]    Family History No family history on file.  Social History Social History   Tobacco Use  . Smoking status: Current Every Day Smoker    Packs/day: 0.50    Types: Cigarettes  . Smokeless tobacco: Never Used  Substance Use Topics  . Alcohol use: Yes    Comment: occasional  . Drug use: No     Allergies   Shellfish allergy   Review of Systems Review of Systems  Constitutional: Negative for activity change, appetite change and fever.  HENT:  Negative for congestion and rhinorrhea.   Respiratory: Negative for cough, chest tightness and shortness of breath.   Cardiovascular: Negative for chest pain.  Gastrointestinal: Negative for abdominal pain, nausea and vomiting.  Genitourinary: Negative for dysuria, hematuria, scrotal swelling and testicular pain.  Musculoskeletal: Negative for arthralgias and myalgias.  Skin: Negative for rash.  Neurological: Negative for dizziness, weakness and headaches.  Psychiatric/Behavioral: Positive for decreased concentration, dysphoric mood, self-injury and suicidal ideas. The patient is nervous/anxious.     all other systems are negative except as noted in the HPI and PMH.    Physical Exam Updated Vital Signs BP 120/74 (BP Location: Left Arm)   Pulse 91   Temp 98 F (36.7 C) (Oral)   Resp 18   SpO2 100%   Physical Exam  Constitutional: He is oriented to person, place, and time. He appears well-developed and well-nourished. No distress.  HENT:  Head: Normocephalic and atraumatic.  Mouth/Throat: Oropharynx is clear and moist. No oropharyngeal exudate.  Eyes: Conjunctivae and EOM are normal. Pupils are equal, round, and reactive to light.  Neck: Normal range of motion. Neck supple.  No meningismus.  Cardiovascular: Normal rate, regular rhythm, normal heart sounds and intact distal pulses.  No murmur heard. Pulmonary/Chest: Effort normal and breath sounds normal. No respiratory distress.  Abdominal: Soft. There is no tenderness.  There is no rebound and no guarding.  Musculoskeletal: Normal range of motion. He exhibits no edema or tenderness.  Neurological: He is alert and oriented to person, place, and time. No cranial nerve deficit. He exhibits normal muscle tone. Coordination normal.  No ataxia on finger to nose bilaterally. No pronator drift. 5/5 strength throughout. CN 2-12 intact.Equal grip strength. Sensation intact.   Skin: Skin is warm.  Psychiatric: He has a normal mood and  affect. His behavior is normal.  Flat affect  Nursing note and vitals reviewed.    ED Treatments / Results  Labs (all labs ordered are listed, but only abnormal results are displayed) Labs Reviewed  COMPREHENSIVE METABOLIC PANEL - Abnormal; Notable for the following components:      Result Value   CO2 21 (*)    AST 42 (*)    ALT 89 (*)    Total Bilirubin 1.6 (*)    All other components within normal limits  ETHANOL - Abnormal; Notable for the following components:   Alcohol, Ethyl (B) 137 (*)    All other components within normal limits  ACETAMINOPHEN LEVEL - Abnormal; Notable for the following components:   Acetaminophen (Tylenol), Serum <10 (*)    All other components within normal limits  CBC - Abnormal; Notable for the following components:   WBC 13.9 (*)    Hemoglobin 17.6 (*)    All other components within normal limits  SALICYLATE LEVEL  RAPID URINE DRUG SCREEN, HOSP PERFORMED    EKG  EKG Interpretation None       Radiology No results found.  Procedures Procedures (including critical care time)  Medications Ordered in ED Medications - No data to display   Initial Impression / Assessment and Plan / ED Course  I have reviewed the triage vital signs and the nursing notes.  Pertinent labs & imaging results that were available during my care of the patient were reviewed by me and considered in my medical decision making (see chart for details).    Suicidal thoughts without plan.  Patient calm and cooperative.  Voluntary at this time.  Screening labs obtained.  Patient denies any physical complaints.  Vitals are stable.  Labs remarkable for alcohol intoxication and slight transaminitis.  TTS consult completed.  Patient is medically clear.  Reevaluation in morning recommended.  Holding orders placed.  Final Clinical Impressions(s) / ED Diagnoses   Final diagnoses:  Suicidal ideation  Alcoholic intoxication without complication [facility](HCC)    ED Discharge  Orders    None       Glynn Octaveancour, Asuzena Weis, MD 04/15/17 0102

## 2017-04-15 ENCOUNTER — Encounter (HOSPITAL_COMMUNITY): Payer: Self-pay | Admitting: Registered Nurse

## 2017-04-15 DIAGNOSIS — F17210 Nicotine dependence, cigarettes, uncomplicated: Secondary | ICD-10-CM

## 2017-04-15 DIAGNOSIS — F4329 Adjustment disorder with other symptoms: Secondary | ICD-10-CM

## 2017-04-15 DIAGNOSIS — Z560 Unemployment, unspecified: Secondary | ICD-10-CM

## 2017-04-15 HISTORY — DX: Adjustment disorder with other symptoms: F43.29

## 2017-04-15 LAB — RAPID URINE DRUG SCREEN, HOSP PERFORMED
Amphetamines: NOT DETECTED
BENZODIAZEPINES: NOT DETECTED
Barbiturates: NOT DETECTED
COCAINE: NOT DETECTED
OPIATES: NOT DETECTED
Tetrahydrocannabinol: NOT DETECTED

## 2017-04-15 LAB — COMPREHENSIVE METABOLIC PANEL
ALT: 89 U/L — ABNORMAL HIGH (ref 17–63)
ANION GAP: 15 (ref 5–15)
AST: 42 U/L — ABNORMAL HIGH (ref 15–41)
Albumin: 4.5 g/dL (ref 3.5–5.0)
Alkaline Phosphatase: 100 U/L (ref 38–126)
BILIRUBIN TOTAL: 1.6 mg/dL — AB (ref 0.3–1.2)
BUN: 9 mg/dL (ref 6–20)
CO2: 21 mmol/L — ABNORMAL LOW (ref 22–32)
Calcium: 9.3 mg/dL (ref 8.9–10.3)
Chloride: 101 mmol/L (ref 101–111)
Creatinine, Ser: 0.73 mg/dL (ref 0.61–1.24)
GFR calc Af Amer: >60 mL/min (ref >60)
GFR calc non Af Amer: >60 mL/min (ref >60)
GLUCOSE: 93 mg/dL (ref 65–99)
POTASSIUM: 3.6 mmol/L (ref 3.5–5.1)
Sodium: 137 mmol/L (ref 135–145)
TOTAL PROTEIN: 7.9 g/dL (ref 6.5–8.1)

## 2017-04-15 LAB — ETHANOL: ALCOHOL ETHYL (B): 137 mg/dL — AB (ref <10)

## 2017-04-15 LAB — SALICYLATE LEVEL: Salicylate Lvl: <7 mg/dL (ref 2.8–30.0)

## 2017-04-15 LAB — ACETAMINOPHEN LEVEL

## 2017-04-15 MED ORDER — GI COCKTAIL ~~LOC~~
30.0000 mL | Freq: Once | ORAL | Status: AC
  Administered 2017-04-15: 30 mL via ORAL
  Filled 2017-04-15: qty 30

## 2017-04-15 MED ORDER — LORATADINE 10 MG PO TABS
10.0000 mg | ORAL_TABLET | Freq: Every day | ORAL | Status: DC
  Administered 2017-04-15: 10 mg via ORAL
  Filled 2017-04-15: qty 1

## 2017-04-15 MED ORDER — PANTOPRAZOLE SODIUM 40 MG PO TBEC
40.0000 mg | DELAYED_RELEASE_TABLET | Freq: Every day | ORAL | Status: DC
  Administered 2017-04-15: 40 mg via ORAL
  Filled 2017-04-15: qty 1

## 2017-04-15 NOTE — ED Notes (Signed)
[facility] nurse know that Pt has been Psy cleared and Resources has been faxed.  Pt's nurse informed.

## 2017-04-15 NOTE — Consult Note (Signed)
Telepsych Consultation   Reason for Consult:  Suicidal ideation Referring Physician:  Ezequiel Essex, MD Location of Patient: [facility] Location of Provider: [facility]  Patient Identification: Tracy Schroeder MRN:  [id number] Principal Diagnosis: Adjustment disorder with emotional disturbance Diagnosis:   Patient Active Problem List   Diagnosis Date Noted  . Adjustment disorder with emotional disturbance [F43.29] 04/15/2017    Total Time spent with patient: 45 minutes  Subjective:   Tracy Schroeder is a 27 y.o. male patient presented to [facility] with complaints of suicidal ideation related to his wife recently leaving him and taking the children.    HPI:  Tracy Schroeder, 27 y.o., male patient seen via telepsych by this provider; chart reviewed and consulted with Dr. Dwyane Dee on 04/15/17.  On evaluation Tracy Schroeder reports "I was a little stressed out and feeling down.  I didn't want to be needed anymore and didn't want to need nobody.  I'm usually a happy go lucky person.  I told my Mama that I wanted to come to the hospital to get some help and she wanted to bring me but I had already called the cops."  Patient states his wife recently left with his children, he had to move back in with his parents; and he is currently unemployed related to the move.  "I guess the alcohol just made it worse."  Patient denies regular use of alcohol "Last night was the first time I drank in six months.  Patient states that he is feeling better this morning.  Patient denies suicidal/homicidal/self-harm ideation, psychosis, and paranoia.  Patient denies prior history of suicide attempt and mental health issues.  Patient stats that he is just seeking information for therapy "Somebody I can talk to."     Past Psychiatric History: None  Risk to Self: Suicidal Ideation: Yes-Currently Present Suicidal Intent: No Is patient at risk for suicide?: Yes Suicidal Plan?: No Access to Means: No What has been your  use of drugs/alcohol within the last 12 months?: Pt reports abusing alcohol How many times?: 0 Other Self Harm Risks: None Triggers for Past Attempts: None known Intentional Self Injurious Behavior: None Risk to Others: Homicidal Ideation: No Thoughts of Harm to Others: No Current Homicidal Intent: No Current Homicidal Plan: No Access to Homicidal Means: No Identified Victim: None History of harm to others?: No Assessment of Violence: None Noted Violent Behavior Description: Pt denies history of violence Does patient have access to weapons?: No Criminal Charges Pending?: No Does patient have a court date: No Prior Inpatient Therapy: Prior Inpatient Therapy: No Prior Therapy Dates: NA Prior Therapy Facilty/Provider(s): NA Reason for Treatment: NA Prior Outpatient Therapy: Prior Outpatient Therapy: Yes Prior Therapy Dates: Age 34 Prior Therapy Facilty/Provider(s): Unknown Reason for Treatment: ADHD Does patient have an ACCT team?: No Does patient have Intensive In-House Services?  : No Does patient have [facility] services? : No Does patient have P4CC services?: No  Past Medical History:  Past Medical History:  Diagnosis Date  . Asthma   . GERD (gastroesophageal reflux disease)   . Panic attacks    History reviewed. No pertinent surgical history. Family History: History reviewed. No pertinent family history. Family Psychiatric  History: Denies Social History:  Social History   Substance and Sexual Activity  Alcohol Use Yes   Comment: occasional     Social History   Substance and Sexual Activity  Drug Use No    Social History   Socioeconomic History  . Marital status: Single  Spouse name: None  . Number of children: None  . Years of education: None  . Highest education level: None  Social Needs  . Financial resource strain: None  . Food insecurity - worry: None  . Food insecurity - inability: None  . Transportation needs - medical: None  . Transportation  needs - non-medical: None  Occupational History  . None  Tobacco Use  . Smoking status: Current Every Day Smoker    Packs/day: 0.50    Types: Cigarettes  . Smokeless tobacco: Never Used  Substance and Sexual Activity  . Alcohol use: Yes    Comment: occasional  . Drug use: No  . Sexual activity: None  Other Topics Concern  . None  Social History Narrative  . None   Additional Social History:    Allergies:   Allergies  Allergen Reactions  . Shellfish Allergy Anaphylaxis    Labs:  Results for orders placed or performed during the hospital encounter of 04/14/17 (from the past 48 hour(s))  Comprehensive metabolic panel     Status: Abnormal   Collection Time: 04/14/17 11:45 PM  Result Value Ref Range   Sodium 137 135 - 145 mmol/L   Potassium 3.6 3.5 - 5.1 mmol/L   Chloride 101 101 - 111 mmol/L   CO2 21 (L) 22 - 32 mmol/L   Glucose, Bld 93 65 - 99 mg/dL   BUN 9 6 - 20 mg/dL   Creatinine, Ser 0.73 0.61 - 1.24 mg/dL   Calcium 9.3 8.9 - 10.3 mg/dL   Total Protein 7.9 6.5 - 8.1 g/dL   Albumin 4.5 3.5 - 5.0 g/dL   AST 42 (H) 15 - 41 U/L   ALT 89 (H) 17 - 63 U/L   Alkaline Phosphatase 100 38 - 126 U/L   Total Bilirubin 1.6 (H) 0.3 - 1.2 mg/dL   GFR calc non Af Amer >60 >60 mL/min   GFR calc Af Amer >60 >60 mL/min    Comment: (NOTE) The eGFR has been calculated using the CKD EPI equation. This calculation has not been validated in all clinical situations. eGFR's persistently <60 mL/min signify possible Chronic Kidney Disease.    Anion gap 15 5 - 15  Ethanol     Status: Abnormal   Collection Time: 04/14/17 11:45 PM  Result Value Ref Range   Alcohol, Ethyl (B) 137 (H) <10 mg/dL    Comment:        LOWEST DETECTABLE LIMIT FOR SERUM ALCOHOL IS 10 mg/dL FOR MEDICAL PURPOSES ONLY   Salicylate level     Status: None   Collection Time: 04/14/17 11:45 PM  Result Value Ref Range   Salicylate Lvl <5.0 2.8 - 30.0 mg/dL  Acetaminophen level     Status: Abnormal   Collection  Time: 04/14/17 11:45 PM  Result Value Ref Range   Acetaminophen (Tylenol), Serum <10 (L) 10 - 30 ug/mL    Comment:        THERAPEUTIC CONCENTRATIONS VARY SIGNIFICANTLY. A RANGE OF 10-30 ug/mL MAY BE AN EFFECTIVE CONCENTRATION FOR MANY PATIENTS. HOWEVER, SOME ARE BEST TREATED AT CONCENTRATIONS OUTSIDE THIS RANGE. ACETAMINOPHEN CONCENTRATIONS >150 ug/mL AT 4 HOURS AFTER INGESTION AND >50 ug/mL AT 12 HOURS AFTER INGESTION ARE OFTEN ASSOCIATED WITH TOXIC REACTIONS.   cbc     Status: Abnormal   Collection Time: 04/14/17 11:45 PM  Result Value Ref Range   WBC 13.9 (H) 4.0 - 10.5 K/uL   RBC 5.41 4.22 - 5.81 MIL/uL   Hemoglobin 17.6 (H) 13.0 -  17.0 g/dL   HCT 50.7 39.0 - 52.0 %   MCV 93.7 78.0 - 100.0 fL   MCH 32.5 26.0 - 34.0 pg   MCHC 34.7 30.0 - 36.0 g/dL   RDW 12.7 11.5 - 15.5 %   Platelets 322 150 - 400 K/uL  Rapid urine drug screen (hospital performed)     Status: None   Collection Time: 04/15/17 12:40 AM  Result Value Ref Range   Opiates NONE DETECTED NONE DETECTED   Cocaine NONE DETECTED NONE DETECTED   Benzodiazepines NONE DETECTED NONE DETECTED   Amphetamines NONE DETECTED NONE DETECTED   Tetrahydrocannabinol NONE DETECTED NONE DETECTED   Barbiturates NONE DETECTED NONE DETECTED    Comment: (NOTE) DRUG SCREEN FOR MEDICAL PURPOSES ONLY.  IF CONFIRMATION IS NEEDED FOR ANY PURPOSE, NOTIFY LAB WITHIN 5 DAYS. LOWEST DETECTABLE LIMITS FOR URINE DRUG SCREEN Drug Class                     Cutoff (ng/mL) Amphetamine and metabolites    1000 Barbiturate and metabolites    200 Benzodiazepine                 694 Tricyclics and metabolites     300 Opiates and metabolites        300 Cocaine and metabolites        300 THC                            50     Medications:  Current Facility-Administered Medications  Medication Dose Route Frequency Provider Last Rate Last Dose  . loratadine (CLARITIN) tablet 10 mg  10 mg Oral Daily Rancour, Stephen, MD   10 mg at 04/15/17 0928   . pantoprazole (PROTONIX) EC tablet 40 mg  40 mg Oral Daily Rancour, Stephen, MD   40 mg at 04/15/17 8546   Current Outpatient Medications  Medication Sig Dispense Refill  . loratadine (CLARITIN) 10 MG tablet Take 10 mg by mouth daily as needed for allergies.     . pantoprazole (PROTONIX) 40 MG tablet Take 40 mg by mouth daily.       Musculoskeletal: Strength & Muscle Tone: within normal limits Gait & Station: normal Patient leans: N/A  Psychiatric Specialty Exam: Physical Exam  ROS  Blood pressure (!) 124/56, pulse 64, temperature 97.8 F (36.6 C), temperature source Oral, resp. rate 17, SpO2 100 %.There is no height or weight on file to calculate BMI.  General Appearance: Casual  Eye Contact:  Good  Speech:  Clear and Coherent and Normal Rate  Volume:  Normal  Mood:  Appropriate  Affect:  Appropriate  Thought Process:  Coherent and Goal Directed  Orientation:  Full (Time, Place, and Person)  Thought Content:  Logical  Suicidal Thoughts:  No  Homicidal Thoughts:  No  Memory:  Immediate;   Good Recent;   Good Remote;   Good  Judgement:  Intact  Insight:  Present  Psychomotor Activity:  Normal  Concentration:  Concentration: Good and Attention Span: Good  Recall:  Good  Fund of Knowledge:  Good  Language:  Good  Akathisia:  No  Handed:  Right  AIMS (if indicated):     Assets:  Communication Skills Desire for Improvement Housing Social Support  ADL's:  Intact  Cognition:  WNL  Sleep:        Treatment Plan Summary: Plan Patient psychiatrically cleared. Give resources for outpatient services therapy/counseling  Disposition: No evidence of imminent risk to self or others at present.   Patient does not meet criteria for psychiatric inpatient admission.  This service was provided via telemedicine using a 2-way, interactive audio and video technology.  Names of all persons participating in this telemedicine service and their role in this encounter. Name:  Earleen Newport NP Role: Telepsych  Name: Dr Dwyane Dee Role: Psychiatrist  Name: Tracy Schroeder Role: Patient  Name:  Role:     Earleen Newport, NP 04/15/2017 10:03 AM

## 2017-04-15 NOTE — Progress Notes (Signed)
Per Assunta FoundShuvon Rankin, NP, the patient does not meet criteria for inpatient treatment. The patient is recommended for discharge and to follow up with an outpatient provider for medication management and counseling services.   Additional resources [facility] at [phone].  The patient is psychiatrically cleared, per Shuvon Rankin,NP.   Neysa Bonitohristy, RN notified.    Baldo DaubJolan Naryiah Schley, MSW, LCSWA Clinical Social Worker (Disposition) [facility]  [phone]

## 2017-04-15 NOTE — BH Assessment (Addendum)
Tele Assessment Note   Patient Name: Tracy SalisburyJoshua Schroeder MRN: [id number] Referring Physician: Glynn OctaveStephen Rancour, MD [facility] of [facility] Location of Provider: Behavioral Health TTS Department  Tracy SalisburyJoshua Brocksmith is an 27 y.o. married male who [facility] voluntarily with law enforcement after contacting them for help. Pt reports he lost his job as a [employer] less than a month ago and one week ago his wife left with their two children to pursue a relationship with her ex-boyfriend. Pt reports he was drinking alcohol tonight, felt worthless and had thoughts "that I didn't want to be here anymore." Pt denies having any plan or intent to harm himself. He denies any history of suicide attempts or intentional self-injurious behavior. Protective factors against suicide include good family support, father to young children, future orientation,no access to fireearms and no prior attempts. Pt acknowledges symptoms including crying spells, decreased concentration and feelings of guilt and worthlessness. He denies problems with sleep or appetite. He denies current homicidal ideations or history of violence. He denies any history of psychotic symptoms.  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 He says two days ago he started drinking and drank 1/2 a fifth of Christiane HaJack Daniels and 12 cans of beer. Pt's blood alcohol level is 137. Pt denies other substance use and Pt's urine drug screen is in process.  Pt identifies unemployment and his wife and children leaving as his primary stressor. Pt is currently residing with his parents and identifies them as his primary support. Pt says tonight he was in the house where he lived with his wife and children and "I just had to get out of that house." Pt reports he has a history of ADHD and asked his primary care physician seven months ago to discontinue Adderall "because I was calm but didn't like feeling  zombified." Pt denies any history of abuse or trauma. Pt denies any legal problems. Pt reports he had brief outpatient counseling at age 27 for ADHD symptoms but otherwise has no history of inpatient or outpatient mental health or substance abuse treatment.  Pt is dressed in hospital scrubs, alert and oriented x4. Pt speaks in a clear tone, at moderate volume and normal pace. Motor behavior appears normal. Eye contact is good. Pt's mood is depressed and sad; affect is congruent with mood. Thought process is coherent and relevant. There is no indication Pt is currently responding to internal stimuli or experiencing delusional thought content. Pt was pleasant and cooperative throughout assessment. He states he is willing to try psychiatric medication if it will make him feel less stressed.    Diagnosis: Major Depressive Disorder, Single Episode, Severe Without Psychotic Features Alcohol Intoxication  Past Medical History:  Past Medical History:  Diagnosis Date  . Asthma   . GERD (gastroesophageal reflux disease)   . Panic attacks     History reviewed. No pertinent surgical history.  Family History: No family history on file.  Social History:  reports that he has been smoking cigarettes.  He has been smoking about 0.50 packs per day. he has never used smokeless tobacco. He reports that he drinks alcohol. He reports that he does not use drugs.  Additional Social History:  Alcohol / Drug Use Pain Medications: See MAR Prescriptions: See MAR Over the Counter: See MAR History of alcohol / drug use?: Yes Longest period of sobriety (when/how long): Three years Negative Consequences of Use: (Pt denies)  Withdrawal Symptoms: (Pt denies) Substance #1 Name of Substance 1: Alcohol  1 - Age of First Use: Adolescent 1 - Amount (size/oz): 1/2 a fifth of Christiane Ha and 12 beers 1 - Frequency: Pt reports he started drinking again 2 days ago after several years 1 - Duration: Started 2 days ago 1 -  Last Use / Amount: 04/14/17  CIWA: CIWA-Ar BP: 120/74 Pulse Rate: 91 COWS:    PATIENT STRENGTHS: (choose at least two) Ability for insight Average or above average intelligence Capable of independent living Communication skills General fund of knowledge Motivation for treatment/growth Physical Health Supportive family/friends Work skills  Allergies:  Allergies  Allergen Reactions  . Shellfish Allergy Anaphylaxis    Home Medications:  (Not in a hospital admission)  OB/GYN Status:  No LMP for male patient.  General Assessment Data Location of Assessment: [facility] TTS Assessment: In system Is this a Tele or Face-to-Face Assessment?: Tele Assessment Is this an Initial Assessment or a Re-assessment for this encounter?: Initial Assessment Marital status: Married [place] name: NA Is patient pregnant?: No Pregnancy Status: No Living Arrangements: Parent(Living with mother and father) Can pt return to current living arrangement?: Yes Admission Status: Voluntary Is patient capable of signing voluntary admission?: Yes Referral Source: Self/Family/Friend Insurance type: Self-pay     Crisis Care Plan Living Arrangements: Parent(Living with mother and father) Legal Guardian: Other:(Self) Name of Psychiatrist: None Name of Therapist: None  Education Status Is patient currently in school?: No Current Grade: NA Highest grade of school patient has completed: 64 Name of school: NA Contact person: NA  Risk to self with the past 6 months Suicidal Ideation: Yes-Currently Present Has patient been a risk to self within the past 6 months prior to admission? : No Suicidal Intent: No Has patient had any suicidal intent within the past 6 months prior to admission? : No Is patient at risk for suicide?: Yes Suicidal Plan?: No Has patient had any suicidal plan within the past 6 months prior to admission? : No Access to Means: No What has been your use of drugs/alcohol within the  last 12 months?: Pt reports abusing alcohol Previous Attempts/Gestures: No How many times?: 0 Other Self Harm Risks: None Triggers for Past Attempts: None known Intentional Self Injurious Behavior: None Family Suicide History: No Recent stressful life event(s): Conflict (Comment), Job Loss, Other (Comment)(Wife left with children) Persecutory voices/beliefs?: No Depression: Yes Depression Symptoms: Despondent, Tearfulness, Guilt, Feeling worthless/self pity Substance abuse history and/or treatment for substance abuse?: No Suicide prevention information given to non-admitted patients: Not applicable  Risk to Others within the past 6 months Homicidal Ideation: No Does patient have any lifetime risk of violence toward others beyond the six months prior to admission? : No Thoughts of Harm to Others: No Current Homicidal Intent: No Current Homicidal Plan: No Access to Homicidal Means: No Identified Victim: None History of harm to others?: No Assessment of Violence: None Noted Violent Behavior Description: Pt denies history of violence Does patient have access to weapons?: No Criminal Charges Pending?: No Does patient have a court date: No Is patient on probation?: No  Psychosis Hallucinations: None noted Delusions: None noted  Mental Status Report Appearance/Hygiene: In scrubs Eye Contact: Good Motor Activity: Unremarkable Speech: Logical/coherent Level of Consciousness: Alert Mood: Depressed, Sad Affect: Depressed Anxiety Level: None Thought Processes: Coherent, Relevant Judgement: Unimpaired Orientation: Person, Place, Time, Situation, Appropriate for developmental age Obsessive Compulsive Thoughts/Behaviors: None  Cognitive Functioning Concentration: Normal Memory: Recent Intact, Remote Intact IQ: Average Insight:  Fair Impulse Control: Good Appetite: Good Weight Loss: 0 Weight Gain: 0 Sleep: No Change Total Hours of Sleep: 8 Vegetative Symptoms:  None  ADLScreening [facility] Assessment Services) Patient's cognitive ability adequate to safely complete daily activities?: Yes Patient able to express need for assistance with ADLs?: Yes Independently performs ADLs?: Yes (appropriate for developmental age)  Prior Inpatient Therapy Prior Inpatient Therapy: No Prior Therapy Dates: NA Prior Therapy Facilty/Provider(s): NA Reason for Treatment: NA  Prior Outpatient Therapy Prior Outpatient Therapy: Yes Prior Therapy Dates: Age 27 Prior Therapy Facilty/Provider(s): Unknown Reason for Treatment: ADHD Does patient have an ACCT team?: No Does patient have Intensive In-House Services?  : No Does patient have [facility] services? : No Does patient have P4CC services?: No  ADL Screening (condition at time of admission) Patient's cognitive ability adequate to safely complete daily activities?: Yes Is the patient deaf or have difficulty hearing?: No Does the patient have difficulty seeing, even when wearing glasses/contacts?: No Does the patient have difficulty concentrating, remembering, or making decisions?: No Patient able to express need for assistance with ADLs?: Yes Does the patient have difficulty dressing or bathing?: No Independently performs ADLs?: Yes (appropriate for developmental age) Does the patient have difficulty walking or climbing stairs?: No Weakness of Legs: None Weakness of Arms/Hands: None  Home Assistive Devices/Equipment Home Assistive Devices/Equipment: None    Abuse/Neglect Assessment (Assessment to be complete while patient is alone) Abuse/Neglect Assessment Can Be Completed: Yes Physical Abuse: Denies Verbal Abuse: Denies Sexual Abuse: Denies Exploitation of patient/patient's resources: Denies Self-Neglect: Denies     Merchant navy officer (For Healthcare) Does Patient Have a Medical Advance Directive?: No Would patient like information on creating a medical advance directive?: No - Patient declined     Additional Information 1:1 In Past 12 Months?: No CIRT Risk: No Elopement Risk: No Does patient have medical clearance?: Yes     Disposition: Gave clinical report to Donell Sievert, PA who recommended Pt be observed overnight due to alcohol intoxication and re-evaluated in the morning. Notified Dr. Glynn Octave and Herbert Seta, RN of recommendation.  Disposition Initial Assessment Completed for this Encounter: Yes Disposition of Patient: Re-evaluation by Psychiatry recommended  This service was provided via telemedicine using a 2-way, interactive audio and video technology.  Names of all persons participating in this telemedicine service and their role in this encounter. Name: [facility] Role: Patient  Name: Shela Commons, [place] Role: TTS counselor         Harlin Rain Patsy [place], [facility], [facility], [facility] Triage Specialist [phone]  Pamalee Leyden 04/15/2017 12:48 AM

## 2017-04-15 NOTE — ED Provider Notes (Signed)
Pt presented to the ED last night voluntarily b/c he was having suicidal thoughts.  He was also intoxicated.  He was reevaluated by TTS this morning and is no longer suicidal.  I spoke with the patient and he said the stress of his wife recently leaving and taking the kids got to him and it brought him down.  However, he does not want to hurt himself.  He has his kids to live for.  He said he will be ok if he goes home.  Pt is stable for d/c.  He is given f/u resources.  Return if worse.   Jacalyn LefevreHaviland, Deeanne Deininger, MD 04/15/17 1150

## 2017-04-22 DIAGNOSIS — F17210 Nicotine dependence, cigarettes, uncomplicated: Secondary | ICD-10-CM

## 2017-04-22 HISTORY — DX: Nicotine dependence, cigarettes, uncomplicated: F17.210

## 2017-04-24 DIAGNOSIS — F411 Generalized anxiety disorder: Secondary | ICD-10-CM

## 2017-04-24 HISTORY — DX: Generalized anxiety disorder: F41.1

## 2017-05-07 ENCOUNTER — Other Ambulatory Visit: Payer: Self-pay

## 2017-05-07 ENCOUNTER — Encounter (HOSPITAL_BASED_OUTPATIENT_CLINIC_OR_DEPARTMENT_OTHER): Payer: Self-pay | Admitting: Emergency Medicine

## 2017-05-07 ENCOUNTER — Emergency Department (HOSPITAL_BASED_OUTPATIENT_CLINIC_OR_DEPARTMENT_OTHER)
Admission: EM | Admit: 2017-05-07 | Discharge: 2017-05-07 | Disposition: A | Discharge status: Home / Self Care | Payer: Self-pay | Attending: Emergency Medicine | Admitting: Emergency Medicine

## 2017-05-07 DIAGNOSIS — K029 Dental caries, unspecified: Secondary | ICD-10-CM | POA: Insufficient documentation

## 2017-05-07 DIAGNOSIS — Z79899 Other long term (current) drug therapy: Secondary | ICD-10-CM | POA: Insufficient documentation

## 2017-05-07 DIAGNOSIS — J45909 Unspecified asthma, uncomplicated: Secondary | ICD-10-CM | POA: Insufficient documentation

## 2017-05-07 DIAGNOSIS — F17210 Nicotine dependence, cigarettes, uncomplicated: Secondary | ICD-10-CM | POA: Insufficient documentation

## 2017-05-07 HISTORY — DX: Anxiety disorder, unspecified: F41.9

## 2017-05-07 MED ORDER — PENICILLIN V POTASSIUM 500 MG PO TABS: 500.0000 mg | ORAL_TABLET | Freq: Four times a day (QID) | ORAL | 0 refills | Status: AC

## 2017-05-07 MED ORDER — HYDROCODONE-ACETAMINOPHEN 5-325 MG PO TABS: 1.0000 | ORAL_TABLET | ORAL | 0 refills | Status: DC | PRN

## 2017-05-07 MED ORDER — IBUPROFEN 600 MG PO TABS: 600.0000 mg | ORAL_TABLET | Freq: Four times a day (QID) | ORAL | 0 refills | Status: DC | PRN

## 2017-05-07 MED FILL — IBUPROFEN 600 MG TABLET: 600 | 7 days supply | Qty: 30 | Fill #0

## 2017-05-07 MED FILL — HYDROCODON-APAP 5-325: 5-325 | 2 days supply | Qty: 6 | Fill #0

## 2017-05-07 MED FILL — PENICILLIN VK 500 MG TABLET: 500 | 7 days supply | Qty: 28 | Fill #0

## 2017-05-07 NOTE — ED Provider Notes (Signed)
[facility] EMERGENCY DEPARTMENT Provider Note   CSN: [id number] Arrival date & time: 05/07/17  0706     History   Chief Complaint Chief Complaint  Patient presents with  . Dental Pain    HPI Tracy [facility] is a 27 y.o. male.  Pt presents today to the ED with dental pain.  He has a Education officer, community, but did not call his dentist.  The pt said it's been hurting for 2 days.  No fever.      Past Medical History:  Diagnosis Date  . Anxiety   . Asthma   . GERD (gastroesophageal reflux disease)   . Panic attacks     Patient Active Problem List   Diagnosis Date Noted  . Adjustment disorder with emotional disturbance 04/15/2017    History reviewed. No pertinent surgical history.     Home Medications    Prior to Admission medications   Medication Sig Start Date End Date Taking? Authorizing Provider  buPROPion (WELLBUTRIN) 100 MG tablet Take 150 mg by mouth daily.   Yes [provider]  HYDROcodone-acetaminophen (NORCO/VICODIN) 5-325 MG tablet Take 1 tablet by mouth every 4 (four) hours as needed. 05/07/17   Jacalyn Lefevre, MD  ibuprofen (ADVIL,MOTRIN) 600 MG tablet Take 1 tablet (600 mg total) by mouth every 6 (six) hours as needed. 05/07/17   Jacalyn Lefevre, MD  loratadine (CLARITIN) 10 MG tablet Take 10 mg by mouth daily as needed for allergies.     [provider]  pantoprazole (PROTONIX) 40 MG tablet Take 40 mg by mouth daily.     [provider]  penicillin v potassium (VEETID) 500 MG tablet Take 1 tablet (500 mg total) by mouth 4 (four) times daily for 7 days. 05/07/17 05/14/17  Jacalyn Lefevre, MD    Family History No family history on file.  Social History Social History   Tobacco Use  . Smoking status: Current Every Day Smoker    Packs/day: 0.50    Types: Cigarettes  . Smokeless tobacco: Never Used  Substance Use Topics  . Alcohol use: No    Frequency: Never    Comment: Sts he "quit drinking"  . Drug use: No      Allergies   Shellfish allergy   Review of Systems Review of Systems  HENT: Positive for dental problem.   All other systems reviewed and are negative.    Physical Exam Updated Vital Signs BP (!) 156/83 (BP Location: Right Arm)   Pulse 60   Temp 98.7 F (37.1 C) (Oral)   Resp 16   Ht 6\' 5"  (1.956 m)   Wt 110.2 kg (243 lb)   SpO2 99%   BMI 28.82 kg/m   Physical Exam  Constitutional: He appears well-developed and well-nourished.  HENT:  Head: Normocephalic and atraumatic.  Right Ear: External ear normal.  Left Ear: External ear normal.  Nose: Nose normal.  Mouth/Throat: Dental caries present.  Multiple dental caries and poor oral hygeine  Neck: Normal range of motion. Neck supple.  Cardiovascular: Normal rate, regular rhythm, normal heart sounds and intact distal pulses.  Pulmonary/Chest: Effort normal and breath sounds normal.  Nursing note and vitals reviewed.    ED Treatments / Results  Labs (all labs ordered are listed, but only abnormal results are displayed) Labs Reviewed - No data to display  EKG  EKG Interpretation None       Radiology No results found.  Procedures Procedures (including critical care time)  Medications Ordered in ED Medications -  No data to display   Initial Impression / Assessment and Plan / ED Course  I have reviewed the triage vital signs and the nursing notes.  Pertinent labs & imaging results that were available during my care of the patient were reviewed by me and considered in my medical decision making (see chart for details).     Pt will be started on pen vk and instructed to call his dentist.  Return if worse.  Final Clinical Impressions(s) / ED Diagnoses   Final diagnoses:  Dental caries    ED Discharge Orders        Ordered    penicillin v potassium (VEETID) 500 MG tablet  4 times daily     05/07/17 0728    ibuprofen (ADVIL,MOTRIN) 600 MG tablet  Every 6 hours PRN     05/07/17 0728     HYDROcodone-acetaminophen (NORCO/VICODIN) 5-325 MG tablet  Every 4 hours PRN     05/07/17 [id number]       Jacalyn LefevreHaviland, Samoria Fedorko, MD 05/07/17 [phone]

## 2017-05-07 NOTE — ED Triage Notes (Signed)
Dental pain for multiple days  - lower jaw, left front teeth.  Obvious decay of multiple teeth.

## 2018-01-06 ENCOUNTER — Encounter (HOSPITAL_COMMUNITY): Payer: Self-pay | Admitting: Emergency Medicine

## 2018-01-06 ENCOUNTER — Emergency Department (HOSPITAL_COMMUNITY): Payer: Self-pay

## 2018-01-06 ENCOUNTER — Other Ambulatory Visit: Payer: Self-pay

## 2018-01-06 ENCOUNTER — Emergency Department (HOSPITAL_COMMUNITY)
Admission: EM | Admit: 2018-01-06 | Discharge: 2018-01-07 | Disposition: A | Discharge status: Home / Self Care | Payer: Self-pay | Attending: Emergency Medicine | Admitting: Emergency Medicine

## 2018-01-06 DIAGNOSIS — Y939 Activity, unspecified: Secondary | ICD-10-CM | POA: Insufficient documentation

## 2018-01-06 DIAGNOSIS — J45909 Unspecified asthma, uncomplicated: Secondary | ICD-10-CM | POA: Insufficient documentation

## 2018-01-06 DIAGNOSIS — F17210 Nicotine dependence, cigarettes, uncomplicated: Secondary | ICD-10-CM | POA: Insufficient documentation

## 2018-01-06 DIAGNOSIS — Z23 Encounter for immunization: Secondary | ICD-10-CM | POA: Insufficient documentation

## 2018-01-06 DIAGNOSIS — Z79899 Other long term (current) drug therapy: Secondary | ICD-10-CM | POA: Insufficient documentation

## 2018-01-06 DIAGNOSIS — Y92481 Parking lot as the place of occurrence of the external cause: Secondary | ICD-10-CM | POA: Insufficient documentation

## 2018-01-06 DIAGNOSIS — Y999 Unspecified external cause status: Secondary | ICD-10-CM | POA: Insufficient documentation

## 2018-01-06 DIAGNOSIS — W3400XA Accidental discharge from unspecified firearms or gun, initial encounter: Secondary | ICD-10-CM | POA: Insufficient documentation

## 2018-01-06 DIAGNOSIS — S71132A Puncture wound without foreign body, left thigh, initial encounter: Secondary | ICD-10-CM | POA: Insufficient documentation

## 2018-01-06 MED ORDER — TETANUS-DIPHTH-ACELL PERTUSSIS 5-2.5-18.5 LF-MCG/0.5 IM SUSP
0.5000 mL | Freq: Once | INTRAMUSCULAR | Status: AC
  Administered 2018-01-07: 0.5 mL via INTRAMUSCULAR
  Filled 2018-01-06: qty 0.5
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
## 2018-01-07 LAB — CBC
HEMATOCRIT: 52.8 % — AB (ref 39.0–52.0)
Hemoglobin: 19.1 g/dL — ABNORMAL HIGH (ref 13.0–17.0)
MCH: 35.4 pg — ABNORMAL HIGH (ref 26.0–34.0)
MCHC: 36.2 g/dL — ABNORMAL HIGH (ref 30.0–36.0)
MCV: 98 fL (ref 78.0–100.0)
Platelets: 254 10*3/uL (ref 150–400)
RBC: 5.39 MIL/uL (ref 4.22–5.81)
RDW: 13.2 % (ref 11.5–15.5)
WBC: 12.5 10*3/uL — ABNORMAL HIGH (ref 4.0–10.5)

## 2018-01-07 LAB — COMPREHENSIVE METABOLIC PANEL
ALBUMIN: 4.5 g/dL (ref 3.5–5.0)
ALT: 98 U/L — AB (ref 0–44)
AST: 56 U/L — ABNORMAL HIGH (ref 15–41)
Alkaline Phosphatase: 126 U/L (ref 38–126)
Anion gap: 10 (ref 5–15)
BILIRUBIN TOTAL: 1.4 mg/dL — AB (ref 0.3–1.2)
BUN: 8 mg/dL (ref 6–20)
CO2: 27 mmol/L (ref 22–32)
Calcium: 9 mg/dL (ref 8.9–10.3)
Chloride: 102 mmol/L (ref 98–111)
Creatinine, Ser: 0.86 mg/dL (ref 0.61–1.24)
GFR calc Af Amer: >60 mL/min (ref >60)
GFR calc non Af Amer: >60 mL/min (ref >60)
Glucose, Bld: 118 mg/dL — ABNORMAL HIGH (ref 70–99)
POTASSIUM: 3.8 mmol/L (ref 3.5–5.1)
Sodium: 139 mmol/L (ref 135–145)
TOTAL PROTEIN: 7.7 g/dL (ref 6.5–8.1)

## 2018-01-07 LAB — PROTIME-INR
INR: 0.91
Prothrombin Time: 12.2 seconds (ref 11.4–15.2)

## 2018-01-07 LAB — ETHANOL: Alcohol, Ethyl (B): 135 mg/dL — ABNORMAL HIGH (ref <10)

## 2018-01-07 LAB — SAMPLE TO BLOOD BANK

## 2018-01-07 NOTE — ED Notes (Signed)
Pt wants to leave and not wait for discharge
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
## 2018-04-13 ENCOUNTER — Emergency Department (HOSPITAL_COMMUNITY): Payer: Self-pay

## 2018-04-13 ENCOUNTER — Emergency Department (HOSPITAL_COMMUNITY)
Admission: EM | Admit: 2018-04-13 | Discharge: 2018-04-13 | Disposition: A | Discharge status: Home / Self Care | Payer: Self-pay | Attending: Emergency Medicine | Admitting: Emergency Medicine

## 2018-04-13 ENCOUNTER — Other Ambulatory Visit: Payer: Self-pay

## 2018-04-13 ENCOUNTER — Encounter (HOSPITAL_COMMUNITY): Payer: Self-pay | Admitting: *Deleted

## 2018-04-13 DIAGNOSIS — F17210 Nicotine dependence, cigarettes, uncomplicated: Secondary | ICD-10-CM | POA: Insufficient documentation

## 2018-04-13 DIAGNOSIS — Z79899 Other long term (current) drug therapy: Secondary | ICD-10-CM | POA: Insufficient documentation

## 2018-04-13 DIAGNOSIS — R0789 Other chest pain: Secondary | ICD-10-CM | POA: Insufficient documentation

## 2018-04-13 DIAGNOSIS — R000 Tachycardia, unspecified: Secondary | ICD-10-CM | POA: Insufficient documentation

## 2018-04-13 DIAGNOSIS — J45909 Unspecified asthma, uncomplicated: Secondary | ICD-10-CM | POA: Insufficient documentation

## 2018-04-13 LAB — CBC WITH DIFFERENTIAL/PLATELET
Abs Immature Granulocytes: 0.05 10*3/uL (ref 0.00–0.07)
Basophils Absolute: 0.1 10*3/uL (ref 0.0–0.1)
Basophils Relative: 1 %
Eosinophils Absolute: 0.3 10*3/uL (ref 0.0–0.5)
Eosinophils Relative: 2 %
HEMATOCRIT: 50.5 % (ref 39.0–52.0)
HEMOGLOBIN: 17.5 g/dL — AB (ref 13.0–17.0)
IMMATURE GRANULOCYTES: 0 %
LYMPHS ABS: 2.9 10*3/uL (ref 0.7–4.0)
LYMPHS PCT: 21 %
MCH: 33.2 pg (ref 26.0–34.0)
MCHC: 34.7 g/dL (ref 30.0–36.0)
MCV: 95.8 fL (ref 80.0–100.0)
MONO ABS: 0.9 10*3/uL (ref 0.1–1.0)
MONOS PCT: 6 %
NEUTROS ABS: 9.9 10*3/uL — AB (ref 1.7–7.7)
NEUTROS PCT: 70 %
PLATELETS: 279 10*3/uL (ref 150–400)
RBC: 5.27 MIL/uL (ref 4.22–5.81)
RDW: 12.1 % (ref 11.5–15.5)
WBC: 14.2 10*3/uL — ABNORMAL HIGH (ref 4.0–10.5)
nRBC: 0 % (ref 0.0–0.2)

## 2018-04-13 LAB — BASIC METABOLIC PANEL
ANION GAP: 9 (ref 5–15)
BUN: 14 mg/dL (ref 6–20)
CO2: 23 mmol/L (ref 22–32)
Calcium: 8.9 mg/dL (ref 8.9–10.3)
Chloride: 101 mmol/L (ref 98–111)
Creatinine, Ser: 0.95 mg/dL (ref 0.61–1.24)
GFR calc Af Amer: >60 mL/min (ref >60)
GLUCOSE: 117 mg/dL — AB (ref 70–99)
Potassium: 3.7 mmol/L (ref 3.5–5.1)
Sodium: 133 mmol/L — ABNORMAL LOW (ref 135–145)

## 2018-04-13 LAB — D-DIMER, QUANTITATIVE: D-Dimer, Quant: 0.45 ug/mL-FEU (ref 0.00–0.50)

## 2018-04-13 LAB — I-STAT TROPONIN, ED: Troponin i, poc: 0 ng/mL (ref 0.00–0.08)

## 2018-04-13 NOTE — ED Provider Notes (Signed)
[facility] EMERGENCY DEPARTMENT Provider Note   CSN: [id number] Arrival date & time: 04/13/18  0354     History   Chief Complaint Chief Complaint  Patient presents with  . Chest Pain    HPI Tracy Schroeder is a 27 y.o. male.  Patient presents to the emergency department for evaluation of chest pain.  Patient reports that he was awakened from sleep with a severe, sharp pain in the left chest and palpitations.  He reports that his heart was going so fast that he could not count his pulse.  Patient administered aspirin during transport by EMS.  His pain has rapidly improved and is nearly completely resolved at arrival to the ER.  Palpitations are also improving.  He has never had similar symptoms in the past.     Past Medical History:  Diagnosis Date  . Anxiety   . Asthma   . GERD (gastroesophageal reflux disease)   . Panic attacks     Patient Active Problem List   Diagnosis Date Noted  . Adjustment disorder with emotional disturbance 04/15/2017    History reviewed. No pertinent surgical history.      Home Medications    Prior to Admission medications   Medication Sig Start Date End Date Taking? Authorizing Provider  buPROPion (WELLBUTRIN) 100 MG tablet Take 150 mg by mouth daily.    [provider]  HYDROcodone-acetaminophen (NORCO/VICODIN) 5-325 MG tablet Take 1 tablet by mouth every 4 (four) hours as needed. 05/07/17   Jacalyn Lefevre, MD  ibuprofen (ADVIL,MOTRIN) 600 MG tablet Take 1 tablet (600 mg total) by mouth every 6 (six) hours as needed. 05/07/17   Jacalyn Lefevre, MD  loratadine (CLARITIN) 10 MG tablet Take 10 mg by mouth daily as needed for allergies.     [provider]  pantoprazole (PROTONIX) 40 MG tablet Take 40 mg by mouth daily.     [provider]    Family History History reviewed. No pertinent family history.  Social History Social History   Tobacco Use  . Smoking status: Current Every Day Smoker    Packs/day:  0.50    Types: Cigarettes  . Smokeless tobacco: Never Used  Substance Use Topics  . Alcohol use: No    Frequency: Never    Comment: Sts he "quit drinking"  . Drug use: No     Allergies   Shellfish allergy   Review of Systems Review of Systems  Cardiovascular: Positive for chest pain and palpitations.  All other systems reviewed and are negative.    Physical Exam Updated Vital Signs BP (!) 141/90   Pulse 87   Temp 98.4 F (36.9 C)   Resp 20   Ht 6\' 4"  (1.93 m)   Wt 115.7 kg   SpO2 97%   BMI 31.04 kg/m   Physical Exam Vitals signs and nursing note reviewed.  Constitutional:      General: He is not in acute distress.    Appearance: Normal appearance. He is well-developed.  HENT:     Head: Normocephalic and atraumatic.     Right Ear: Hearing normal.     Left Ear: Hearing normal.     Nose: Nose normal.  Eyes:     Conjunctiva/sclera: Conjunctivae normal.     Pupils: Pupils are equal, round, and reactive to light.  Neck:     Musculoskeletal: Normal range of motion and neck supple.  Cardiovascular:     Rate and Rhythm: Regular rhythm.     Heart sounds: S1  normal and S2 normal. No murmur. No friction rub. No gallop.   Pulmonary:     Effort: Pulmonary effort is normal. No respiratory distress.     Breath sounds: Normal breath sounds.  Chest:     Chest wall: No tenderness.  Abdominal:     General: Bowel sounds are normal.     Palpations: Abdomen is soft.     Tenderness: There is no abdominal tenderness. There is no guarding or rebound. Negative signs include Murphy's sign and McBurney's sign.     Hernia: No hernia is present.  Musculoskeletal: Normal range of motion.  Skin:    General: Skin is warm and dry.     Findings: No rash.  Neurological:     Mental Status: He is alert and oriented to person, place, and time.     GCS: GCS eye subscore is 4. GCS verbal subscore is 5. GCS motor subscore is 6.     Cranial Nerves: No cranial nerve deficit.     Sensory: No  sensory deficit.     Coordination: Coordination normal.  Psychiatric:        Speech: Speech normal.        Behavior: Behavior normal.        Thought Content: Thought content normal.      ED Treatments / Results  Labs (all labs ordered are listed, but only abnormal results are displayed) Labs Reviewed  CBC WITH DIFFERENTIAL/PLATELET - Abnormal; Notable for the following components:      Result Value   WBC 14.2 (*)    Hemoglobin 17.5 (*)    Neutro Abs 9.9 (*)    All other components within normal limits  BASIC METABOLIC PANEL - Abnormal; Notable for the following components:   Sodium 133 (*)    Glucose, Bld 117 (*)    All other components within normal limits  D-DIMER, QUANTITATIVE (NOT AT [facility])  I-STAT TROPONIN, ED    EKG EKG Interpretation  Date/Time:  Monday April 13 2018 03:57:58 EST Ventricular Rate:  105 PR Interval:    QRS Duration: 96 QT Interval:  336 QTC Calculation: 444 R Axis:   38 Text Interpretation:  Sinus tachycardia Otherwise within normal limits Confirmed by Gilda CreasePollina, Christopher J [phone]([id number]) on 04/13/2018 4:04:05 AM   Radiology Dg Chest 2 View  Result Date: 04/13/2018 CLINICAL DATA:  Acute onset of mid chest pain. EXAM: CHEST - 2 VIEW COMPARISON:  Chest radiograph performed 02/20/2017 FINDINGS: The lungs are well-aerated and clear. There is no evidence of focal opacification, pleural effusion or pneumothorax. The heart is normal in size; the mediastinal contour is within normal limits. No acute osseous abnormalities are seen. IMPRESSION: No acute cardiopulmonary process seen. Electronically Signed   By: Roanna RaiderJeffery  Chang M.D.   On: 04/13/2018 06:44    Procedures Procedures (including critical care time)  Medications Ordered in ED Medications - No data to display   Initial Impression / Assessment and Plan / ED Course  I have reviewed the triage vital signs and the nursing notes.  Pertinent labs & imaging results that were available during my care  of the patient were reviewed by me and considered in my medical decision making (see chart for details).     Patient presents to the emergency part with complaints of chest pain and heart palpitations.  Patient reports being awakened from sleep with a sharp pain in the left side of his chest.  He then noticed that his heart was racing.  He was unable to  count his pulse because it was so fast.  He thinks it was above 180 bpm.  He has never had any history of arrhythmia.  EMS report initial tachycardia but did not document an EKG or tracing.  At arrival to the ER pain is resolved and he is borderline tachycardic at around 100 bpm.  He is not experiencing shortness of breath.  His d-dimer is negative.  Troponin is negative.  Symptoms are atypical for CAD.  Will require cardiology follow-up as an outpatient for possible outpatient monitoring.  Final Clinical Impressions(s) / ED Diagnoses   Final diagnoses:  Atypical chest pain  Tachycardia    ED Discharge Orders    None       Gilda CreasePollina, Christopher J, MD 04/13/18 [phone]

## 2018-04-13 NOTE — ED Triage Notes (Signed)
Pt brought in by [facility] for c/o chest pain that woke him up from sleep; pt states the pain is in the center of his chest and radiates to the left side; pt c/o left arm numbness and right arm tingling; pt given asa 324mg  en route by ems; pt states the pain is worse with pressure to the chest

## 2018-04-13 NOTE — ED Notes (Signed)
Notified lab regarding blood work`

## 2018-05-13 ENCOUNTER — Ambulatory Visit: Payer: Self-pay | Admitting: Cardiology

## 2018-05-13 NOTE — Progress Notes (Deleted)
     Clinical Summary Tracy Schroeder is a 28 y.o.male seen as new patient for chest pain   1. Chest pain - ER visit 04/13/18 with chest pain and palpitations. Tried to check his pulse and was so fast had trouble counting.  - D-dimer neg, K 3.7, trop neg - EKG sinus tach 105 Past Medical History:  Diagnosis Date  . Anxiety   . Asthma   . GERD (gastroesophageal reflux disease)   . Panic attacks      Allergies  Allergen Reactions  . Shellfish Allergy Anaphylaxis     Current Outpatient Medications  Medication Sig Dispense Refill  . buPROPion (WELLBUTRIN) 100 MG tablet Take 150 mg by mouth daily.    Marland Kitchen HYDROcodone-acetaminophen (NORCO/VICODIN) 5-325 MG tablet Take 1 tablet by mouth every 4 (four) hours as needed. 6 tablet 0  . ibuprofen (ADVIL,MOTRIN) 600 MG tablet Take 1 tablet (600 mg total) by mouth every 6 (six) hours as needed. 30 tablet 0  . loratadine (CLARITIN) 10 MG tablet Take 10 mg by mouth daily as needed for allergies.     . pantoprazole (PROTONIX) 40 MG tablet Take 40 mg by mouth daily.      No current facility-administered medications for this visit.      No past surgical history on file.   Allergies  Allergen Reactions  . Shellfish Allergy Anaphylaxis      No family history on file.   Social History Tracy Schroeder reports that he has been smoking cigarettes. He has been smoking about 0.50 packs per day. He has never used smokeless tobacco. Tracy Schroeder reports no history of alcohol use.   Review of Systems CONSTITUTIONAL: No weight loss, fever, chills, weakness or fatigue.  HEENT: Eyes: No visual loss, blurred vision, double vision or yellow sclerae.No hearing loss, sneezing, congestion, runny nose or sore throat.  SKIN: No rash or itching.  CARDIOVASCULAR:  RESPIRATORY: No shortness of breath, cough or sputum.  GASTROINTESTINAL: No anorexia, nausea, vomiting or diarrhea. No abdominal pain or blood.  GENITOURINARY: No burning on urination, no  polyuria NEUROLOGICAL: No headache, dizziness, syncope, paralysis, ataxia, numbness or tingling in the extremities. No change in bowel or bladder control.  MUSCULOSKELETAL: No muscle, back pain, joint pain or stiffness.  LYMPHATICS: No enlarged nodes. No history of splenectomy.  PSYCHIATRIC: No history of depression or anxiety.  ENDOCRINOLOGIC: No reports of sweating, cold or heat intolerance. No polyuria or polydipsia.  Marland Kitchen   Physical Examination There were no vitals filed for this visit. There were no vitals filed for this visit.  Gen: resting comfortably, no acute distress HEENT: no scleral icterus, pupils equal round and reactive, no palptable cervical adenopathy,  CV Resp: Clear to auscultation bilaterally GI: abdomen is soft, non-tender, non-distended, normal bowel sounds, no hepatosplenomegaly MSK: extremities are warm, no edema.  Skin: warm, no rash Neuro:  no focal deficits Psych: appropriate affect   Diagnostic Studies     Assessment and Plan        Tracy Schroeder, M.D., F.A.C.C.

## 2018-05-14 ENCOUNTER — Encounter: Payer: Self-pay | Admitting: Cardiology

## 2018-12-15 ENCOUNTER — Encounter (HOSPITAL_BASED_OUTPATIENT_CLINIC_OR_DEPARTMENT_OTHER): Payer: Self-pay

## 2018-12-15 ENCOUNTER — Emergency Department (HOSPITAL_BASED_OUTPATIENT_CLINIC_OR_DEPARTMENT_OTHER): Payer: Self-pay

## 2018-12-15 ENCOUNTER — Other Ambulatory Visit: Payer: Self-pay

## 2018-12-15 ENCOUNTER — Emergency Department (HOSPITAL_BASED_OUTPATIENT_CLINIC_OR_DEPARTMENT_OTHER)
Admission: EM | Admit: 2018-12-15 | Discharge: 2018-12-15 | Disposition: A | Discharge status: Home / Self Care | Payer: Self-pay | Attending: Emergency Medicine | Admitting: Emergency Medicine

## 2018-12-15 DIAGNOSIS — J0110 Acute frontal sinusitis, unspecified: Secondary | ICD-10-CM | POA: Insufficient documentation

## 2018-12-15 DIAGNOSIS — Z79899 Other long term (current) drug therapy: Secondary | ICD-10-CM | POA: Insufficient documentation

## 2018-12-15 DIAGNOSIS — Z87891 Personal history of nicotine dependence: Secondary | ICD-10-CM | POA: Insufficient documentation

## 2018-12-15 MED ORDER — FLUTICASONE PROPIONATE 50 MCG/ACT NA SUSP: 1.0000 | Freq: Every day | NASAL | 0 refills | Status: DC

## 2018-12-15 MED ORDER — AMOXICILLIN-POT CLAVULANATE 875-125 MG PO TABS: 1.0000 | ORAL_TABLET | Freq: Two times a day (BID) | ORAL | 0 refills | Status: DC

## 2018-12-15 NOTE — ED Provider Notes (Signed)
[facility] EMERGENCY DEPARTMENT Provider Note   CSN: [id number] Arrival date & time: 12/15/18  1925     History   Chief Complaint Chief Complaint  Patient presents with  . Nasal Congestion    HPI Tracy Schroeder is a 28 y.o. male with a history of anxiety, asthma, GERD, panic attacks, and former tobacco use who presents to the emergency department with complaints of "I have a sinus infection".  Patient states he has had symptoms including nasal congestion, rhinorrhea (right nare greater than left nare), and right frontal sinus pain/pressure.  He states that he has minimal cough at night.  No alleviating or aggravating factors to his symptoms.  He states this feels similar to prior sinus infections requiring antibiotics.  Symptoms have been occurring for 6 days and progressively worsening.  No alleviating or aggravating factors.  Denies fever, sore throat, ear pain, shortness of breath, body aches, or known exposures to anyone with COVID-19.  He is not concerned that he has 19.     HPI  Past Medical History:  Diagnosis Date  . Anxiety   . Asthma   . GERD (gastroesophageal reflux disease)   . Panic attacks     Patient Active Problem List   Diagnosis Date Noted  . Adjustment disorder with emotional disturbance 04/15/2017    History reviewed. No pertinent surgical history.      Home Medications    Prior to Admission medications   Medication Sig Start Date End Date Taking? Authorizing Provider  buPROPion (WELLBUTRIN) 100 MG tablet Take 150 mg by mouth daily.    [provider]  HYDROcodone-acetaminophen (NORCO/VICODIN) 5-325 MG tablet Take 1 tablet by mouth every 4 (four) hours as needed. 05/07/17   Jacalyn LefevreHaviland, Julie, MD  ibuprofen (ADVIL,MOTRIN) 600 MG tablet Take 1 tablet (600 mg total) by mouth every 6 (six) hours as needed. 05/07/17   Jacalyn LefevreHaviland, Julie, MD  loratadine (CLARITIN) 10 MG tablet Take 10 mg by mouth daily as needed for allergies.     [provider]  pantoprazole (PROTONIX) 40 MG tablet Take 40 mg by mouth daily.     [provider]    Family History No family history on file.  Social History Social History   Tobacco Use  . Smoking status: Former Smoker    Packs/day: 0.50    Types: Cigarettes  . Smokeless tobacco: Current User  Substance Use Topics  . Alcohol use: Yes    Frequency: Never    Comment: occ  . Drug use: No     Allergies   Shellfish allergy   Review of Systems Review of Systems  Constitutional: Negative for chills and fever.  HENT: Positive for congestion, rhinorrhea, sinus pressure and sinus pain.   Eyes: Negative for visual disturbance.  Respiratory: Positive for cough. Negative for shortness of breath.   Cardiovascular: Negative for chest pain.  Gastrointestinal: Negative for diarrhea, nausea and vomiting.  Musculoskeletal: Negative for myalgias.  Neurological: Negative for syncope.     Physical Exam Updated Vital Signs BP (!) 141/82 (BP Location: Left Arm)   Pulse 86   Temp 99.2 F (37.3 C) (Oral)   Resp 20   Ht 6\' 4"  (1.93 m)   Wt 122.9 kg   SpO2 100%   BMI 32.99 kg/m   Physical Exam Vitals signs and nursing note reviewed.  Constitutional:      General: He is not in acute distress.    Appearance: He is well-developed. He is not toxic-appearing.  HENT:     Head: Normocephalic and atraumatic.     Right Ear: Tympanic membrane is not scarred, perforated, erythematous, retracted or bulging.     Left Ear: Tympanic membrane is not scarred, perforated, erythematous, retracted or bulging.     Nose: Congestion present.     Right Sinus: Frontal sinus tenderness present. No maxillary sinus tenderness.     Left Sinus: No maxillary sinus tenderness or frontal sinus tenderness.     Mouth/Throat:     Mouth: Mucous membranes are moist.     Pharynx: Uvula midline. No oropharyngeal exudate or posterior oropharyngeal erythema.     Tonsils: No tonsillar exudate.      Comments: Posterior oropharynx is symmetric appearing. Patient tolerating own secretions without difficulty. No trismus. No drooling. No hot potato voice. No swelling beneath the tongue, submandibular compartment is soft.  Eyes:     General:        Right eye: No discharge.        Left eye: No discharge.     Conjunctiva/sclera: Conjunctivae normal.  Neck:     Musculoskeletal: Neck supple. No neck rigidity or crepitus.  Cardiovascular:     Rate and Rhythm: Normal rate and regular rhythm.  Pulmonary:     Effort: Pulmonary effort is normal. No respiratory distress.     Breath sounds: Normal breath sounds. No wheezing, rhonchi or rales.  Abdominal:     General: There is no distension.     Palpations: Abdomen is soft.     Tenderness: There is no abdominal tenderness.  Lymphadenopathy:     Cervical: No cervical adenopathy.  Skin:    General: Skin is warm and dry.     Findings: No rash.  Neurological:     Mental Status: He is alert.     Comments: Clear speech.   Psychiatric:        Behavior: Behavior normal.    ED Treatments / Results  Labs (all labs ordered are listed, but only abnormal results are displayed) Labs Reviewed - No data to display  EKG None  Radiology Dg Chest [facility] 1 View  Result Date: 12/15/2018 CLINICAL DATA:  Cough and fever. EXAM: PORTABLE CHEST 1 VIEW COMPARISON:  April 13, 2018 FINDINGS: The heart size and mediastinal contours are within normal limits. Both lungs are clear. The visualized skeletal structures are unremarkable. IMPRESSION: No active disease. Electronically Signed   By: Constance Holster M.D.   On: 12/15/2018 21:01    Procedures Procedures (including critical care time)  Medications Ordered in ED Medications - No data to display   Initial Impression / Assessment and Plan / ED Course  I have reviewed the triage vital signs and the nursing notes.  Pertinent labs & imaging results that were available during my care of the patient were  reviewed by me and considered in my medical decision making (see chart for details).   Patient presents to the ED w/ complaints of congestion, rhinorrhea, sinus pain/pressure. Nontoxic appearing, no apparent distress, vitals WNL with the exception of elevated BP which normalized with repeat vitals, doubt HTN emergency.  Lungs clear to auscultation bilaterally, chest x-ray per triage negative for infiltrate, doubt pneumonia.  On exam patient has nasal congestion as well as right frontal sinus tenderness.  Suspect sinusitis, viral vs. Allergic vs. bacteria, however given patient has had progressive worsening of his symptoms do not feel it is unreasonable to provide him with an antibiotic to cover for possible acute bacterial sinusitis.  Recommend he  watch and wait for the next 48 hours given only 5 days thus far, however have provided a prescription for Augmentin to use should his sxs not improve w/ flonase. I discussed results, treatment plan, need for follow-up, and return precautions with the patient. Provided opportunity for questions, patient confirmed understanding and is in agreement with plan.     Tracy Schroeder was evaluated in Emergency Department on 12/15/2018 for the symptoms described in the history of present illness. He/she was evaluated in the context of the global COVID-19 pandemic, which necessitated consideration that the patient might be at risk for infection with the SARS-CoV-2 virus that causes COVID-19. Institutional protocols and algorithms that pertain to the evaluation of patients at risk for COVID-19 are in a state of rapid change based on information released by regulatory bodies including the CDC and federal and state organizations. These policies and algorithms were followed during the patient's care in the ED.  Final Clinical Impressions(s) / ED Diagnoses   Final diagnoses:  Acute frontal sinusitis, recurrence not specified    ED Discharge Orders         Ordered    fluticasone  (FLONASE) 50 MCG/ACT nasal spray  Daily     12/15/18 2131    amoxicillin-clavulanate (AUGMENTIN) 875-125 MG tablet  Every 12 hours     12/15/18 2131           Joannie Medine, Pleas Koch, PA-C 12/15/18 2140    Melene Plan, DO 12/15/18 2236

## 2018-12-15 NOTE — ED Triage Notes (Signed)
Pt c/o flu like sx x 5 days-NAD-steady gait 

## 2018-12-15 NOTE — Discharge Instructions (Addendum)
You were seen in the emergency department for sinus pain.   We are sending him with a prescription for Flonase, and intranasal steroid, please use 1 spray per nostril daily. We are also sending home with antibiotics, Augmentin, please wait 48 hours to take this to see if you improve with the Flonase alone as you may not need the antibiotic.  We have prescribed you new medication(s) today. Discuss the medications prescribed today with your pharmacist as they can have adverse effects and interactions with your other medicines including over the counter and prescribed medications. Seek medical evaluation if you start to experience new or abnormal symptoms after taking one of these medicines, seek care immediately if you start to experience difficulty breathing, feeling of your throat closing, facial swelling, or rash as these could be indications of a more serious allergic reaction  Follow-up with primary care within a week.  Return to the ER for new or worsening symptoms including but not limited to fever, neck stiffness, visual changes, or any other concerns.

## 2018-12-20 ENCOUNTER — Emergency Department (HOSPITAL_COMMUNITY): Payer: Self-pay

## 2018-12-20 ENCOUNTER — Other Ambulatory Visit: Payer: Self-pay

## 2018-12-20 ENCOUNTER — Encounter (HOSPITAL_COMMUNITY): Payer: Self-pay

## 2018-12-20 ENCOUNTER — Emergency Department (HOSPITAL_COMMUNITY)
Admission: EM | Admit: 2018-12-20 | Discharge: 2018-12-20 | Disposition: A | Discharge status: Home / Self Care | Payer: Self-pay | Attending: Emergency Medicine | Admitting: Emergency Medicine

## 2018-12-20 DIAGNOSIS — S61211A Laceration without foreign body of left index finger without damage to nail, initial encounter: Secondary | ICD-10-CM | POA: Insufficient documentation

## 2018-12-20 DIAGNOSIS — Z87891 Personal history of nicotine dependence: Secondary | ICD-10-CM | POA: Insufficient documentation

## 2018-12-20 DIAGNOSIS — Y939 Activity, unspecified: Secondary | ICD-10-CM | POA: Insufficient documentation

## 2018-12-20 DIAGNOSIS — Y93G2 Activity, grilling and smoking food: Secondary | ICD-10-CM | POA: Insufficient documentation

## 2018-12-20 DIAGNOSIS — Y929 Unspecified place or not applicable: Secondary | ICD-10-CM | POA: Insufficient documentation

## 2018-12-20 DIAGNOSIS — Y999 Unspecified external cause status: Secondary | ICD-10-CM | POA: Insufficient documentation

## 2018-12-20 DIAGNOSIS — Z79899 Other long term (current) drug therapy: Secondary | ICD-10-CM | POA: Insufficient documentation

## 2018-12-20 DIAGNOSIS — W260XXA Contact with knife, initial encounter: Secondary | ICD-10-CM | POA: Insufficient documentation

## 2018-12-20 MED ORDER — LIDOCAINE HCL (PF) 1 % IJ SOLN
10.0000 mL | Freq: Once | INTRAMUSCULAR | Status: DC
  Filled 2018-12-20: qty 30

## 2018-12-20 NOTE — ED Provider Notes (Signed)
[facility] EMERGENCY DEPARTMENT Provider Note   CSN: [id number] Arrival date & time: 12/20/18  9629     History   Chief Complaint Chief Complaint  Patient presents with  . Laceration    HPI Tracy Schroeder is a 28 y.o. male.     The history is provided by the patient. No language interpreter was used.  Laceration Location:  Finger Finger laceration location:  L index finger Length:  1.5 Depth:  Through dermis Quality: jagged   Bleeding: controlled   Laceration mechanism:  Knife Pain details:    Quality:  Aching   Severity:  Mild Foreign body present:  No foreign bodies Relieved by:  Nothing Worsened by:  Nothing Ineffective treatments:  None tried Tetanus status:  Up to date Pt states he cut his finger chopping barbeque.  Pt is right handed.   Past Medical History:  Diagnosis Date  . Anxiety   . Asthma   . GERD (gastroesophageal reflux disease)   . Panic attacks     Patient Active Problem List   Diagnosis Date Noted  . Adjustment disorder with emotional disturbance 04/15/2017    History reviewed. No pertinent surgical history.      Home Medications    Prior to Admission medications   Medication Sig Start Date End Date Taking? Authorizing Provider  amoxicillin-clavulanate (AUGMENTIN) 875-125 MG tablet Take 1 tablet by mouth every 12 (twelve) hours. 12/15/18   Petrucelli, Samantha R, PA-C  buPROPion (WELLBUTRIN) 100 MG tablet Take 150 mg by mouth daily.    [provider]  fluticasone (FLONASE) 50 MCG/ACT nasal spray Place 1 spray into both nostrils daily. 12/15/18   Petrucelli, Samantha R, PA-C  HYDROcodone-acetaminophen (NORCO/VICODIN) 5-325 MG tablet Take 1 tablet by mouth every 4 (four) hours as needed. 05/07/17   Isla Pence, MD  ibuprofen (ADVIL,MOTRIN) 600 MG tablet Take 1 tablet (600 mg total) by mouth every 6 (six) hours as needed. 05/07/17   Isla Pence, MD  loratadine (CLARITIN) 10 MG tablet Take 10 mg by mouth daily as needed for  allergies.     [provider]  pantoprazole (PROTONIX) 40 MG tablet Take 40 mg by mouth daily.     [provider]    Family History History reviewed. No pertinent family history.  Social History Social History   Tobacco Use  . Smoking status: Former Smoker    Packs/day: 0.50    Types: Cigarettes  . Smokeless tobacco: Current User  Substance Use Topics  . Alcohol use: Yes    Frequency: Never    Comment: occ  . Drug use: No     Allergies   Shellfish allergy   Review of Systems Review of Systems  Skin: Positive for wound.  All other systems reviewed and are negative.    Physical Exam Updated Vital Signs BP 136/77 (BP Location: Right Arm)   Pulse 92   Temp 98.2 F (36.8 C) (Oral)   Resp 18   Ht 6\' 4"  (1.93 m)   Wt 122.9 kg   SpO2 100%   BMI 32.99 kg/m   Physical Exam Vitals signs and nursing note reviewed.  HENT:     Head: Normocephalic.  Musculoskeletal: Normal range of motion.        General: Signs of injury present.     Comments: 1.5 cm laceration dorsal aspect of left index finger,  From nv and ns intact  Skin:    General: Skin is warm.  Neurological:     General: No  focal deficit present.     Mental Status: He is alert.  Psychiatric:        Mood and Affect: Mood normal.      ED Treatments / Results  Labs (all labs ordered are listed, but only abnormal results are displayed) Labs Reviewed - No data to display  EKG None  Radiology Dg Finger Index Left  Result Date: 12/20/2018 CLINICAL DATA:  Left index finger laceration EXAM: LEFT INDEX FINGER 2+V COMPARISON:  None. FINDINGS: There is no evidence of fracture or dislocation. There is no evidence of arthropathy or other focal bone abnormality. Soft tissues are unremarkable. No radiopaque foreign body. IMPRESSION: No fracture or foreign body. Electronically Signed   By: Charlett NoseKevin  Dover M.D.   On: 12/20/2018 19:57    Procedures .Marland Kitchen.Laceration Repair  Date/Time: 12/20/2018 8:59  PM Performed by: Elson AreasSofia, Ryken Paschal K, PA-C Authorized by: Elson AreasSofia, Milus Fritze K, PA-C   Consent:    Consent obtained:  Verbal   Consent given by:  Patient   Risks discussed:  Infection   Alternatives discussed:  No treatment Anesthesia (see MAR for exact dosages):    Anesthesia method:  Local infiltration Laceration details:    Location:  Finger   Finger location:  L index finger   Length (cm):  1.7   Depth (mm):  3 Repair type:    Repair type:  Simple Pre-procedure details:    Preparation:  Patient was prepped and draped in usual sterile fashion Exploration:    Wound exploration: wound explored through full range of motion     Contaminated: no   Treatment:    Area cleansed with:  Betadine   Amount of cleaning:  Standard   Irrigation solution:  Sterile saline Skin repair:    Repair method:  Sutures   Suture size:  5-0   Suture material:  Prolene   Suture technique:  Simple interrupted   Number of sutures:  5 Approximation:    Approximation:  Loose Post-procedure details:    Patient tolerance of procedure:  Tolerated well, no immediate complications   (including critical care time)  Medications Ordered in ED Medications  lidocaine (PF) (XYLOCAINE) 1 % injection 10 mL (has no administration in time range)     Initial Impression / Assessment and Plan / ED Course  I have reviewed the triage vital signs and the nursing notes.  Pertinent labs & imaging results that were available during my care of the patient were reviewed by me and considered in my medical decision making (see chart for details).        MDM   Pt counseled on wound care.  Pt placed in a splint  Pt advised suture removal in 8 days   Final Clinical Impressions(s) / ED Diagnoses   Final diagnoses:  Laceration of left index finger without foreign body without damage to nail, initial encounter    ED Discharge Orders    None    An After Visit Summary was printed and given to the patient.    Elson AreasSofia,  Najae Rathert K, PA-C 12/20/18 2310    Pricilla LovelessGoldston, Scott, MD 12/20/18 [phone]

## 2018-12-20 NOTE — Discharge Instructions (Signed)
Suture removal in 8 days  °

## 2018-12-20 NOTE — ED Triage Notes (Signed)
Pt arrived vis ems for a finger laceration. Pt was cutting meat with a cleaver when he cut his finger. Michela Pitcher it was to the bone. Finger is wrapped by ems bleeding appears controlled. Last tetanus 3 years ago.

## 2019-02-15 ENCOUNTER — Encounter (HOSPITAL_COMMUNITY): Payer: Self-pay | Admitting: Emergency Medicine

## 2019-02-15 ENCOUNTER — Other Ambulatory Visit: Payer: Self-pay

## 2019-02-15 ENCOUNTER — Emergency Department (HOSPITAL_COMMUNITY)
Admission: EM | Admit: 2019-02-15 | Discharge: 2019-02-15 | Disposition: A | Discharge status: Home / Self Care | Payer: Self-pay | Attending: Emergency Medicine | Admitting: Emergency Medicine

## 2019-02-15 DIAGNOSIS — K029 Dental caries, unspecified: Secondary | ICD-10-CM

## 2019-02-15 DIAGNOSIS — Z87891 Personal history of nicotine dependence: Secondary | ICD-10-CM | POA: Insufficient documentation

## 2019-02-15 DIAGNOSIS — K0889 Other specified disorders of teeth and supporting structures: Secondary | ICD-10-CM | POA: Insufficient documentation

## 2019-02-15 DIAGNOSIS — J45909 Unspecified asthma, uncomplicated: Secondary | ICD-10-CM | POA: Insufficient documentation

## 2019-02-15 MED ORDER — TRAMADOL HCL 50 MG PO TABS: 100.0000 mg | ORAL_TABLET | Freq: Once | ORAL | Status: DC

## 2019-02-15 MED ORDER — AMOXICILLIN 250 MG PO CAPS: 500.0000 mg | ORAL_CAPSULE | Freq: Once | ORAL | Status: DC

## 2019-02-15 MED ORDER — AMOXICILLIN 500 MG PO CAPS: 500.0000 mg | ORAL_CAPSULE | Freq: Three times a day (TID) | ORAL | 0 refills | Status: DC

## 2019-02-15 MED ORDER — ONDANSETRON HCL 4 MG PO TABS: 4.0000 mg | ORAL_TABLET | Freq: Once | ORAL | Status: DC

## 2019-02-15 NOTE — ED Provider Notes (Signed)
[facility] EMERGENCY DEPARTMENT Provider Note   CSN: [id number] Arrival date & time: 02/15/19  1817     History   Chief Complaint Chief Complaint  Patient presents with  . Dental Pain    HPI Tracy Schroeder is a 28 y.o. male.     The history is provided by the patient.  Dental Pain Location:  Upper Upper teeth location:  10/LU lateral incisor Quality:  Throbbing Severity:  Moderate Onset quality:  Sudden Duration:  3 days Timing:  Intermittent Progression:  Worsening Chronicity: acute on chronic. Context: dental caries and poor dentition   Relieved by:  Nothing Worsened by:  Cold food/drink and hot food/drink Ineffective treatments:  Ice Associated symptoms: headaches   Associated symptoms: no congestion, no facial swelling and no neck pain   Risk factors: lack of dental care     Past Medical History:  Diagnosis Date  . Anxiety   . Asthma   . GERD (gastroesophageal reflux disease)   . Panic attacks     Patient Active Problem List   Diagnosis Date Noted  . Adjustment disorder with emotional disturbance 04/15/2017    History reviewed. No pertinent surgical history.      Home Medications    Prior to Admission medications   Medication Sig Start Date End Date Taking? Authorizing Provider  amoxicillin-clavulanate (AUGMENTIN) 875-125 MG tablet Take 1 tablet by mouth every 12 (twelve) hours. 12/15/18   Petrucelli, Samantha R, PA-C  buPROPion (WELLBUTRIN) 100 MG tablet Take 150 mg by mouth daily.    [provider]  fluticasone (FLONASE) 50 MCG/ACT nasal spray Place 1 spray into both nostrils daily. 12/15/18   Petrucelli, Samantha R, PA-C  HYDROcodone-acetaminophen (NORCO/VICODIN) 5-325 MG tablet Take 1 tablet by mouth every 4 (four) hours as needed. 05/07/17   Jacalyn LefevreHaviland, Julie, MD  ibuprofen (ADVIL,MOTRIN) 600 MG tablet Take 1 tablet (600 mg total) by mouth every 6 (six) hours as needed. 05/07/17   Jacalyn LefevreHaviland, Julie, MD  loratadine (CLARITIN) 10 MG tablet  Take 10 mg by mouth daily as needed for allergies.     [provider]  pantoprazole (PROTONIX) 40 MG tablet Take 40 mg by mouth daily.     [provider]    Family History History reviewed. No pertinent family history.  Social History Social History   Tobacco Use  . Smoking status: Former Smoker    Packs/day: 0.50    Types: Cigarettes  . Smokeless tobacco: Current User  Substance Use Topics  . Alcohol use: Yes    Frequency: Never    Comment: occ  . Drug use: No     Allergies   Shellfish allergy   Review of Systems Review of Systems  Constitutional: Positive for appetite change. Negative for activity change.  HENT: Positive for dental problem. Negative for congestion, ear discharge, ear pain, facial swelling, nosebleeds, rhinorrhea, sneezing and tinnitus.   Eyes: Negative for photophobia, pain and discharge.  Respiratory: Negative for cough, choking, shortness of breath and wheezing.   Cardiovascular: Negative for chest pain, palpitations and leg swelling.  Gastrointestinal: Negative for abdominal pain, blood in stool, constipation, diarrhea, nausea and vomiting.  Genitourinary: Negative for difficulty urinating, dysuria, flank pain, frequency and hematuria.  Musculoskeletal: Negative for back pain, gait problem, myalgias and neck pain.  Skin: Negative for color change, rash and wound.  Neurological: Positive for headaches. Negative for dizziness, seizures, syncope, facial asymmetry, speech difficulty, weakness and numbness.  Hematological: Negative for adenopathy. Does not bruise/bleed easily.  Psychiatric/Behavioral: Negative  for agitation, confusion, hallucinations, self-injury and suicidal ideas. The patient is not nervous/anxious.      Physical Exam Updated Vital Signs BP (!) 155/85 (BP Location: Right Arm)   Pulse 77   Temp 97.8 F (36.6 C) (Oral)   Resp 18   Ht 6\' 4"  (1.93 m)   Wt 106.6 kg   SpO2 99%   BMI 28.61 kg/m   Physical Exam  Vitals signs and nursing note reviewed.  Constitutional:      Appearance: He is well-developed. He is not toxic-appearing.  HENT:     Head: Normocephalic.     Right Ear: Tympanic membrane and external ear normal.     Left Ear: Tympanic membrane and external ear normal.     Mouth/Throat:     Comments: There are multiple dental caries of the upper and lower gum area.  There is increased redness of the left upper gum over the canine area.  There is a slightly raised area, and question of a early abscess present.  There are multiple teeth that are decayed to the gumline.  There is no swelling under the tongue.  The airway is patent. Eyes:     General: Lids are normal.     Pupils: Pupils are equal, round, and reactive to light.  Neck:     Musculoskeletal: Normal range of motion and neck supple.     Vascular: No carotid bruit.  Cardiovascular:     Rate and Rhythm: Normal rate and regular rhythm.     Pulses: Normal pulses.     Heart sounds: Normal heart sounds.  Pulmonary:     Effort: No respiratory distress.     Breath sounds: Normal breath sounds.  Abdominal:     General: Bowel sounds are normal.     Palpations: Abdomen is soft.     Tenderness: There is no abdominal tenderness. There is no guarding.  Musculoskeletal: Normal range of motion.  Lymphadenopathy:     Head:     Right side of head: No submandibular adenopathy.     Left side of head: No submandibular adenopathy.     Cervical: Cervical adenopathy present.  Skin:    General: Skin is warm and dry.  Neurological:     Mental Status: He is alert and oriented to person, place, and time.     Cranial Nerves: No cranial nerve deficit.     Sensory: No sensory deficit.  Psychiatric:        Speech: Speech normal.      ED Treatments / Results  Labs (all labs ordered are listed, but only abnormal results are displayed) Labs Reviewed - No data to display  EKG None  Radiology No results found.  Procedures Procedures  (including critical care time)  Medications Ordered in ED Medications - No data to display   Initial Impression / Assessment and Plan / ED Course  I have reviewed the triage vital signs and the nursing notes.  Pertinent labs & imaging results that were available during my care of the patient were reviewed by me and considered in my medical decision making (see chart for details).          Final Clinical Impressions(s) / ED Diagnoses MDM  Vital signs reviewed.  Pulse oximetry is 99% on room air.  Within normal limits by my interpretation.  Patient has multiple dental caries present.  Most of them decayed to the gumline.  There is increased redness of the gum over the left upper canine Teeth  numbers 10 and 11.  There is a slightly raised area present, and question if the patient is developing an abscess. There is no swelling under the tongue.  Airway is patent.  No evidence for Ludewig's angina.  Prescription for Amoxil given to the patient.  The patient says he has stomach ulcers.  I have asked him to use Tylenol extra strength every 4 hours for his discomfort.  Patient was given a Arts development officer and he was advised to see a dentist as soon as possible.   Final diagnoses:  Dental caries  Pain, dental    ED Discharge Orders    None       Lily Kocher, PA-C 02/15/19 1853    Noemi Chapel, MD 02/16/19 1550

## 2019-02-15 NOTE — Discharge Instructions (Addendum)
Your blood pressure is slightly elevated at 155/85, your vital signs are otherwise within normal limits.  Your examination reveals multiple teeth that have advanced decay.  You may be developing a small abscess of your left upper gum.  Please use salt water swishes several several times during the day.  Use Tylenol extra strength every 4 hours.  Use Amoxil 3 times daily.  Please see a dentist as soon as possible to resolve this issue.  I have included a dental resource guide in your discharge instructions.

## 2019-02-15 NOTE — ED Triage Notes (Signed)
Pt c/o of left sided dental pain. Pt was eating a candied apple and states "it cracked my tooth"

## 2019-02-16 ENCOUNTER — Other Ambulatory Visit: Payer: Self-pay

## 2019-02-16 ENCOUNTER — Emergency Department (HOSPITAL_BASED_OUTPATIENT_CLINIC_OR_DEPARTMENT_OTHER)
Admission: EM | Admit: 2019-02-16 | Discharge: 2019-02-16 | Disposition: A | Discharge status: Home / Self Care | Payer: Self-pay | Attending: Emergency Medicine | Admitting: Emergency Medicine

## 2019-02-16 ENCOUNTER — Encounter (HOSPITAL_BASED_OUTPATIENT_CLINIC_OR_DEPARTMENT_OTHER): Payer: Self-pay | Admitting: *Deleted

## 2019-02-16 DIAGNOSIS — Z79899 Other long term (current) drug therapy: Secondary | ICD-10-CM | POA: Insufficient documentation

## 2019-02-16 DIAGNOSIS — K029 Dental caries, unspecified: Secondary | ICD-10-CM | POA: Insufficient documentation

## 2019-02-16 DIAGNOSIS — J45909 Unspecified asthma, uncomplicated: Secondary | ICD-10-CM | POA: Insufficient documentation

## 2019-02-16 DIAGNOSIS — Z87891 Personal history of nicotine dependence: Secondary | ICD-10-CM | POA: Insufficient documentation

## 2019-02-16 DIAGNOSIS — K0889 Other specified disorders of teeth and supporting structures: Secondary | ICD-10-CM

## 2019-02-16 MED ORDER — HYDROCODONE-ACETAMINOPHEN 5-325 MG PO TABS: 1.0000 | ORAL_TABLET | Freq: Four times a day (QID) | ORAL | 0 refills | Status: AC | PRN

## 2019-02-16 NOTE — ED Triage Notes (Signed)
Left sided dental pain for about 4 days. Seen at [facility] last night, states he was given rx for abx, "but nothing for pain". Has tried to contact dentist. Tmax today 100

## 2019-02-16 NOTE — ED Provider Notes (Signed)
[facility] EMERGENCY DEPARTMENT Provider Note   CSN: [id number] Arrival date & time: 02/16/19  2022     History   Chief Complaint Chief Complaint  Patient presents with   Dental Pain    HPI Tracy Schroeder is a 28 y.o. male.     Patient is a 28 year old male with past medical history of anxiety, asthma, GERD presenting to the emergency department for dental pain.  Patient states "I need pain meds doc".  Reports that he has severe dental pain related to dental caries.  This has been ongoing but acutely worsened.  Reports he was seen by [facility] last night and was told to take Tylenol.  Reports that this is not helping and he believes he is over taking Tylenol and over-the-counter analgesics to try and help with the pain.  Reports he has not slept.  Reports he called the dentist but that they could not get him in until December 2 and he needs some relief.  Denies any fever, chills, trouble breathing or swallowing, throat or facial swelling.     Past Medical History:  Diagnosis Date   Anxiety    Asthma    GERD (gastroesophageal reflux disease)    Panic attacks     Patient Active Problem List   Diagnosis Date Noted   Adjustment disorder with emotional disturbance 04/15/2017    History reviewed. No pertinent surgical history.      Home Medications    Prior to Admission medications   Medication Sig Start Date End Date Taking? Authorizing Provider  amoxicillin (AMOXIL) 500 MG capsule Take 1 capsule (500 mg total) by mouth 3 (three) times daily. 02/15/19   Ivery QualeBryant, Hobson, PA-C  amoxicillin-clavulanate (AUGMENTIN) 875-125 MG tablet Take 1 tablet by mouth every 12 (twelve) hours. 12/15/18   Petrucelli, Samantha R, PA-C  buPROPion (WELLBUTRIN) 100 MG tablet Take 150 mg by mouth daily.    [provider]  fluticasone (FLONASE) 50 MCG/ACT nasal spray Place 1 spray into both nostrils daily. 12/15/18   Petrucelli, Pleas KochSamantha R, PA-C  HYDROcodone-acetaminophen  (NORCO/VICODIN) 5-325 MG tablet Take 1 tablet by mouth every 6 (six) hours as needed for up to 2 days for severe pain. 02/16/19 02/18/19  Arlyn DunningMcLean, Ollen Rao A, PA-C  ibuprofen (ADVIL,MOTRIN) 600 MG tablet Take 1 tablet (600 mg total) by mouth every 6 (six) hours as needed. 05/07/17   Jacalyn LefevreHaviland, Julie, MD  loratadine (CLARITIN) 10 MG tablet Take 10 mg by mouth daily as needed for allergies.     [provider]  pantoprazole (PROTONIX) 40 MG tablet Take 40 mg by mouth daily.     [provider]    Family History No family history on file.  Social History Social History   Tobacco Use   Smoking status: Former Smoker    Packs/day: 0.50    Types: Cigarettes   Smokeless tobacco: Current User  Substance Use Topics   Alcohol use: Yes    Frequency: Never    Comment: occ   Drug use: No     Allergies   Shellfish allergy   Review of Systems Review of Systems  Constitutional: Negative for appetite change, chills and fever.  HENT: Positive for dental problem. Negative for sore throat and trouble swallowing.   Respiratory: Negative for chest tightness and shortness of breath.   Gastrointestinal: Negative for nausea and vomiting.  Musculoskeletal: Negative for back pain.  Skin: Negative for rash and wound.  Allergic/Immunologic: Negative for immunocompromised state.  Hematological: Does not bruise/bleed  easily.     Physical Exam Updated Vital Signs BP 132/86    Pulse 87    Temp 98.5 F (36.9 C) (Oral)    Resp 18    SpO2 100%   Physical Exam Vitals signs and nursing note reviewed.  Constitutional:      Appearance: Normal appearance.  HENT:     Head: Normocephalic.     Mouth/Throat:     Pharynx: Oropharynx is clear. Uvula midline. No pharyngeal swelling, oropharyngeal exudate, posterior oropharyngeal erythema or uvula swelling.     Comments: Severe dental caries throughout.  In the left upper and lower molars there is tenderness to palpation in the teeth and gums with  swelling in the gums.  No definite palpable abscess.  The submental and submandibular spaces are soft and nontender without palpable abscesses.  The airway is patent.  No peritonsillar abscess. Eyes:     Conjunctiva/sclera: Conjunctivae normal.  Pulmonary:     Effort: Pulmonary effort is normal.  Skin:    General: Skin is dry.  Neurological:     Mental Status: He is alert.  Psychiatric:        Mood and Affect: Mood normal.      ED Treatments / Results  Labs (all labs ordered are listed, but only abnormal results are displayed) Labs Reviewed - No data to display  EKG None  Radiology No results found.  Procedures Procedures (including critical care time)  Medications Ordered in ED Medications - No data to display   Initial Impression / Assessment and Plan / ED Course  I have reviewed the triage vital signs and the nursing notes.  Pertinent labs & imaging results that were available during my care of the patient were reviewed by me and considered in my medical decision making (see chart for details).  Clinical Course as of Feb 15 2041  Tue Feb 16, 2019  2040 Patient seen for dental pain.  Was given amoxicillin prescription and told to take Tylenol last night in any pain.  Reports the pain is severe and is requesting something stronger for pain until he can see the dentist on December 3 when he is scheduled.  I discussed treatment options for the patients pain to include narcotic and non-narcotic medications. I discussed the risks of narcotic medications in full detail to include overdose and addiction. Patient verbalized full understanding of risks of narcotic medication but still insisted to take rx for narcotic pain medication due to the severity of their pain.  Prior to providing a prescription for a controlled substance, I independently reviewed the patient's recent prescription history on the [place] Controlled Substance Reporting System. The patient had no recent  or regular prescriptions and was deemed appropriate for a brief, less than 3 day prescription of narcotic for acute analgesia.   I do not have a concern for Ludwick's angina.  He probably has some smaller dental abscesses.  His airway is patent and he is tolerating p.o.  Advised to continue amoxicillin and follow-up with dentist as scheduled or he may contact the dental walk-in clinic for which I gave him the information for    [KM]    Clinical Course User Index [KM] Arlyn Dunning, PA-C       Based on review of vitals, medical screening exam, lab work and/or imaging, there does not appear to be an acute, emergent etiology for the patient's symptoms. Counseled pt on good return precautions and encouraged both PCP and ED follow-up as needed.  Prior to discharge, I also discussed incidental imaging findings with patient in detail and advised appropriate, recommended follow-up in detail.  Clinical Impression: 1. Pain, dental   2. Dental caries     Disposition: Discharge  Prior to providing a prescription for a controlled substance, I independently reviewed the patient's recent prescription history on the [place]. The patient had no recent or regular prescriptions and was deemed appropriate for a brief, less than 3 day prescription of narcotic for acute analgesia.  This note was prepared with assistance of Systems analyst. Occasional wrong-word or sound-a-like substitutions may have occurred due to the inherent limitations of voice recognition software.   Final Clinical Impressions(s) / ED Diagnoses   Final diagnoses:  Pain, dental  Dental caries    ED Discharge Orders         Ordered    HYDROcodone-acetaminophen (NORCO/VICODIN) 5-325 MG tablet  Every 6 hours PRN     02/16/19 2034           Alveria Apley, PA-C 02/16/19 2042    Lennice Sites, DO 02/16/19 2048

## 2019-02-16 NOTE — Discharge Instructions (Addendum)
[facility]  has emergency walk in clinics and video visits [address] [place], [place] [address]   [phone]   Please make sure you take this medication responsibly as it is addictive and can cause overdose and drowsiness. If you have worsening symptoms return to he ER for evaluation of possible abscess. We will not refill any narcotic prescription in the ER and ultimately you will NEED to see a dentist to resolve your dental issues.Thank you for allowing me to care for you today. Please return to the emergency department if you have new or worsening symptoms. Take your medications as instructed.

## 2019-04-12 ENCOUNTER — Other Ambulatory Visit: Payer: Self-pay

## 2019-04-12 ENCOUNTER — Ambulatory Visit: Payer: Self-pay | Attending: Internal Medicine

## 2019-04-12 DIAGNOSIS — Z20822 Contact with and (suspected) exposure to covid-19: Secondary | ICD-10-CM

## 2019-04-14 LAB — NOVEL CORONAVIRUS, NAA: SARS-CoV-2, NAA: NOT DETECTED

## 2020-12-05 ENCOUNTER — Emergency Department (HOSPITAL_BASED_OUTPATIENT_CLINIC_OR_DEPARTMENT_OTHER)
Admission: EM | Admit: 2020-12-05 | Discharge: 2020-12-05 | Disposition: A | Discharge status: Home / Self Care | Payer: Self-pay | Attending: Emergency Medicine | Admitting: Emergency Medicine

## 2020-12-05 ENCOUNTER — Emergency Department (HOSPITAL_BASED_OUTPATIENT_CLINIC_OR_DEPARTMENT_OTHER): Payer: Self-pay

## 2020-12-05 ENCOUNTER — Encounter (HOSPITAL_BASED_OUTPATIENT_CLINIC_OR_DEPARTMENT_OTHER): Payer: Self-pay

## 2020-12-05 ENCOUNTER — Other Ambulatory Visit: Payer: Self-pay

## 2020-12-05 DIAGNOSIS — W231XXA Caught, crushed, jammed, or pinched between stationary objects, initial encounter: Secondary | ICD-10-CM | POA: Insufficient documentation

## 2020-12-05 DIAGNOSIS — S6742XA Crushing injury of left wrist and hand, initial encounter: Secondary | ICD-10-CM | POA: Insufficient documentation

## 2020-12-05 DIAGNOSIS — Z87891 Personal history of nicotine dependence: Secondary | ICD-10-CM | POA: Insufficient documentation

## 2020-12-05 DIAGNOSIS — S67191A Crushing injury of left index finger, initial encounter: Secondary | ICD-10-CM | POA: Insufficient documentation

## 2020-12-05 DIAGNOSIS — S6722XA Crushing injury of left hand, initial encounter: Secondary | ICD-10-CM

## 2020-12-05 DIAGNOSIS — J45909 Unspecified asthma, uncomplicated: Secondary | ICD-10-CM | POA: Insufficient documentation

## 2020-12-05 DIAGNOSIS — Y990 Civilian activity done for income or pay: Secondary | ICD-10-CM | POA: Insufficient documentation

## 2020-12-05 MED ORDER — IBUPROFEN 800 MG PO TABS
ORAL_TABLET | ORAL | Status: AC
  Administered 2020-12-05: 800 mg
  Filled 2020-12-05: qty 1

## 2020-12-05 MED ORDER — IBUPROFEN 800 MG PO TABS: 800.0000 mg | ORAL_TABLET | Freq: Once | ORAL | Status: AC

## 2020-12-05 MED ORDER — BACITRACIN ZINC 500 UNIT/GM EX OINT
TOPICAL_OINTMENT | Freq: Two times a day (BID) | CUTANEOUS | Status: DC
  Filled 2020-12-05: qty 28.35

## 2020-12-05 NOTE — Discharge Instructions (Addendum)
Apply ice to left hand, tylenol and ibuprofen as needed for pain. Keep elevated.

## 2020-12-05 NOTE — ED Provider Notes (Signed)
[facility] EMERGENCY DEPARTMENT Provider Note   CSN: [id number] Arrival date & time: 12/05/20  1433     History Chief Complaint  Patient presents with   Hand Injury    Tracy Schroeder is a 30 y.o. male.  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 Injury happened approximately 30 minutes ago. States that moderate bleeding occurred from laceration to superior palm of left hand. Also endorses small abrasion to posterior hand. Minimal range of motion to left wrist and fingers, especially left second digit. States that last tetanus shot was 2 years ago.  The history is provided by the patient. No language interpreter was used.  Hand Injury Location:  Wrist, hand and finger Wrist location:  L wrist Hand location:  L hand Finger location:  L index finger Injury: yes   Time since incident:  30 minutes Mechanism of injury: crush   Crush injury:    Mechanism:  Office manager weight of object:  1000 lbs Tetanus status:  Up to date Associated symptoms: decreased range of motion       Past Medical History:  Diagnosis Date   Anxiety    Asthma    GERD (gastroesophageal reflux disease)    Panic attacks     Patient Active Problem List   Diagnosis Date Noted   Adjustment disorder with emotional disturbance 04/15/2017    History reviewed. No pertinent surgical history.     History reviewed. No pertinent family history.  Social History   Tobacco Use   Smoking status: Former    Packs/day: 0.50    Types: Cigarettes   Smokeless tobacco: Current  Vaping Use   Vaping Use: Never used  Substance Use Topics   Alcohol use: Yes    Comment: occ   Drug use: No    Home Medications Prior to Admission medications   Medication Sig Start Date End Date Taking? Authorizing Provider  amoxicillin (AMOXIL) 500 MG capsule Take 1 capsule (500 mg total) by mouth 3 (three) times daily.  02/15/19   Ivery Quale, PA-C  amoxicillin-clavulanate (AUGMENTIN) 875-125 MG tablet Take 1 tablet by mouth every 12 (twelve) hours. 12/15/18   Petrucelli, Samantha R, PA-C  buPROPion (WELLBUTRIN) 100 MG tablet Take 150 mg by mouth daily.    [provider]  fluticasone (FLONASE) 50 MCG/ACT nasal spray Place 1 spray into both nostrils daily. 12/15/18   Petrucelli, Samantha R, PA-C  ibuprofen (ADVIL,MOTRIN) 600 MG tablet Take 1 tablet (600 mg total) by mouth every 6 (six) hours as needed. 05/07/17   Jacalyn Lefevre, MD  loratadine (CLARITIN) 10 MG tablet Take 10 mg by mouth daily as needed for allergies.     [provider]  pantoprazole (PROTONIX) 40 MG tablet Take 40 mg by mouth daily.     [provider]    Allergies    Shellfish allergy  Review of Systems   Review of Systems  Musculoskeletal:  Negative for joint swelling.  All other systems reviewed and are negative.  Physical Exam Updated Vital Signs BP 120/69 (BP Location: Right Arm)   Pulse 78   Temp 98.5 F (36.9 C) (Oral)   Resp 16   Ht 6\' 4"  (1.93 m)   Wt 106.6 kg   SpO2 97%   BMI 28.61 kg/m   Physical Exam Vitals and nursing note reviewed.  Constitutional:      General: He is  not in acute distress.    Appearance: Normal appearance. He is not ill-appearing or diaphoretic.  Musculoskeletal:        General: Tenderness and signs of injury present. No swelling or deformity.     Left hand: Tenderness present. Decreased range of motion. Decreased strength of wrist extension. Normal sensation. Normal capillary refill. Normal pulse.       Arms:     Comments: 2 cm abrasion noted to left palm and 1 cm abrasion noted to left posterior hand.   Neurological:     Mental Status: He is alert.    ED Results / Procedures / Treatments   Labs (all labs ordered are listed, but only abnormal results are displayed) Labs Reviewed - No data to display  EKG None  Radiology DG Wrist Complete Left  Result  Date: 12/05/2020 CLINICAL DATA:  Crush injury, soft tissue injury, laceration EXAM: LEFT HAND - COMPLETE 3+ VIEW; LEFT WRIST - COMPLETE 3+ VIEW COMPARISON:  12/20/2018 FINDINGS: Left hand: Frontal, oblique, and lateral views of the left hand are obtained. No acute fracture, subluxation, or dislocation. The joint spaces are well preserved. Soft tissues are unremarkable. Left wrist: Frontal, oblique, lateral, and ulnar deviated views of the left wrist are obtained. No fracture, subluxation, or dislocation. Joint spaces are well preserved. Soft tissues are unremarkable. IMPRESSION: 1. No acute displaced fracture. Electronically Signed   By: Sharlet Salina M.D.   On: 12/05/2020 15:29   DG Hand Complete Left  Result Date: 12/05/2020 CLINICAL DATA:  Crush injury, soft tissue injury, laceration EXAM: LEFT HAND - COMPLETE 3+ VIEW; LEFT WRIST - COMPLETE 3+ VIEW COMPARISON:  12/20/2018 FINDINGS: Left hand: Frontal, oblique, and lateral views of the left hand are obtained. No acute fracture, subluxation, or dislocation. The joint spaces are well preserved. Soft tissues are unremarkable. Left wrist: Frontal, oblique, lateral, and ulnar deviated views of the left wrist are obtained. No fracture, subluxation, or dislocation. Joint spaces are well preserved. Soft tissues are unremarkable. IMPRESSION: 1. No acute displaced fracture. Electronically Signed   By: Sharlet Salina M.D.   On: 12/05/2020 15:29    Procedures Procedures   Medications Ordered in ED Medications  bacitracin ointment (has no administration in time range)    ED Course  I have reviewed the triage vital signs and the nursing notes.  Pertinent labs & imaging results that were available during my care of the patient were reviewed by me and considered in my medical decision making (see chart for details).    MDM Rules/Calculators/A&P                         Patient comes in today with complaint of left hand injury. Imaging of hand and wrist  unremarkable for acute injury. Will recommend wound care with soap and water and bacitracin ointment, pain control with tylenol and ibuprofen, ice and elevation.   Final Clinical Impression(s) / ED Diagnoses Final diagnoses:  Crushing injury of left hand, initial encounter    Rx / DC Orders ED Discharge Orders     None     An After Visit Summary was printed and given to the patient.    Silva Bandy, PA 12/05/20 1609    Virgina Norfolk, DO 12/06/20 0010

## 2020-12-05 NOTE — ED Notes (Signed)
Last tetanus 5 years ago.

## 2020-12-05 NOTE — ED Triage Notes (Signed)
Pt states left hand got crushed between two 1,000 lb pieces of metal. States has laceration and "chunk of his palm" missing.

## 2021-04-02 ENCOUNTER — Encounter (HOSPITAL_BASED_OUTPATIENT_CLINIC_OR_DEPARTMENT_OTHER): Payer: Self-pay | Admitting: Emergency Medicine

## 2021-04-02 ENCOUNTER — Other Ambulatory Visit: Payer: Self-pay

## 2021-04-02 ENCOUNTER — Emergency Department (HOSPITAL_BASED_OUTPATIENT_CLINIC_OR_DEPARTMENT_OTHER)
Admission: EM | Admit: 2021-04-02 | Discharge: 2021-04-02 | Disposition: A | Discharge status: Home / Self Care | Payer: Self-pay | Attending: Emergency Medicine | Admitting: Emergency Medicine

## 2021-04-02 ENCOUNTER — Emergency Department (HOSPITAL_BASED_OUTPATIENT_CLINIC_OR_DEPARTMENT_OTHER): Payer: Self-pay

## 2021-04-02 DIAGNOSIS — R110 Nausea: Secondary | ICD-10-CM | POA: Insufficient documentation

## 2021-04-02 DIAGNOSIS — R109 Unspecified abdominal pain: Secondary | ICD-10-CM | POA: Insufficient documentation

## 2021-04-02 DIAGNOSIS — Z5321 Procedure and treatment not carried out due to patient leaving prior to being seen by health care provider: Secondary | ICD-10-CM | POA: Insufficient documentation

## 2021-04-02 DIAGNOSIS — R300 Dysuria: Secondary | ICD-10-CM | POA: Insufficient documentation

## 2021-04-02 DIAGNOSIS — R319 Hematuria, unspecified: Secondary | ICD-10-CM | POA: Insufficient documentation

## 2021-04-02 DIAGNOSIS — R350 Frequency of micturition: Secondary | ICD-10-CM | POA: Insufficient documentation

## 2021-04-02 LAB — URINALYSIS, ROUTINE W REFLEX MICROSCOPIC
Bilirubin Urine: NEGATIVE
Glucose, UA: NEGATIVE mg/dL
Hgb urine dipstick: NEGATIVE
Ketones, ur: NEGATIVE mg/dL
Leukocytes,Ua: NEGATIVE
Nitrite: NEGATIVE
Protein, ur: NEGATIVE mg/dL
Specific Gravity, Urine: <1.005 — ABNORMAL LOW (ref 1.005–1.030)
pH: 5 (ref 5.0–8.0)

## 2021-04-02 NOTE — ED Notes (Signed)
Per Registration, this Patient left Department prior to being seen by EDP. Patient to be discharged accordingly.

## 2021-04-02 NOTE — ED Triage Notes (Signed)
Pt arrives to ED with c/o dysuria. This started at 10am today. Associated symptoms include hematuria, urinary frequency, bilateral flank pain (L>R), nausea, bilateral groin pain. Pain on urination is described as sharp. Pain 6/10.

## 2021-05-06 ENCOUNTER — Encounter (HOSPITAL_COMMUNITY): Payer: Self-pay | Admitting: Emergency Medicine

## 2021-05-06 ENCOUNTER — Emergency Department (HOSPITAL_COMMUNITY)
Admission: EM | Admit: 2021-05-06 | Discharge: 2021-05-06 | Disposition: A | Discharge status: Home / Self Care | Payer: Self-pay | Attending: Emergency Medicine | Admitting: Emergency Medicine

## 2021-05-06 DIAGNOSIS — F410 Panic disorder [episodic paroxysmal anxiety] without agoraphobia: Secondary | ICD-10-CM | POA: Insufficient documentation

## 2021-05-06 DIAGNOSIS — J45909 Unspecified asthma, uncomplicated: Secondary | ICD-10-CM | POA: Insufficient documentation

## 2021-05-06 MED ORDER — SERTRALINE HCL 25 MG PO TABS: 25.0000 mg | ORAL_TABLET | Freq: Every day | ORAL | 0 refills | Status: DC
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
## 2021-05-06 NOTE — ED Notes (Signed)
ED Provider at bedside. 

## 2021-05-06 NOTE — ED Triage Notes (Signed)
Pt c/o having a mental breakdown. Pt states he thinks his anxiety is getting the best of him right now. Pt denies SI/HI.
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
## 2021-05-22 ENCOUNTER — Other Ambulatory Visit: Payer: Self-pay

## 2021-05-22 ENCOUNTER — Emergency Department (HOSPITAL_BASED_OUTPATIENT_CLINIC_OR_DEPARTMENT_OTHER)
Admission: EM | Admit: 2021-05-22 | Discharge: 2021-05-22 | Disposition: A | Discharge status: Home / Self Care | Payer: Self-pay | Attending: Emergency Medicine | Admitting: Emergency Medicine

## 2021-05-22 ENCOUNTER — Encounter (HOSPITAL_BASED_OUTPATIENT_CLINIC_OR_DEPARTMENT_OTHER): Payer: Self-pay

## 2021-05-22 DIAGNOSIS — K0889 Other specified disorders of teeth and supporting structures: Secondary | ICD-10-CM | POA: Insufficient documentation

## 2021-05-22 MED ORDER — CEPHALEXIN 250 MG PO CAPS
1000.0000 mg | ORAL_CAPSULE | Freq: Once | ORAL | Status: AC
  Administered 2021-05-22: 1000 mg via ORAL
  Filled 2021-05-22: qty 4

## 2021-05-22 MED ORDER — KETOROLAC TROMETHAMINE 60 MG/2ML IM SOLN
30.0000 mg | Freq: Once | INTRAMUSCULAR | Status: AC
  Administered 2021-05-22: 30 mg via INTRAMUSCULAR
  Filled 2021-05-22: qty 2

## 2021-05-22 MED ORDER — CEPHALEXIN 500 MG PO CAPS: 500.0000 mg | ORAL_CAPSULE | Freq: Four times a day (QID) | ORAL | 0 refills | Status: AC

## 2021-05-22 NOTE — ED Provider Notes (Signed)
[facility] EMERGENCY DEPARTMENT Provider Note   CSN: [id number] Arrival date & time: 05/22/21  0247     History  Chief Complaint  Patient presents with   Dental Pain    Tracy Schroeder is a 31 y.o. male.  31 year old male with significant dental caries and anxiety issues presents to the emergency department today with dental pain.  Patient states that he has severe anxiety and often times grinds his teeth at night.  He stopped taking Zoloft and has been grinding his teeth more than normal and he feels like he chipped or broke his left front incisor and has severe pain in that area.  States he had this kind of issue before with infections and knows antibiotics were cleared up.  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  He does not have a current dentist.  He does not have medical or dental care at this time.   Dental Pain     Home Medications Prior to Admission medications   Medication Sig Start Date End Date Taking? Authorizing Provider  cephALEXin (KEFLEX) 500 MG capsule Take 1 capsule (500 mg total) by mouth 4 (four) times daily for 10 days. 05/22/21 06/01/21 Yes Gizzelle Lacomb, Barbara Cower, MD  buPROPion (WELLBUTRIN) 100 MG tablet Take 150 mg by mouth daily.    [provider]  fluticasone (FLONASE) 50 MCG/ACT nasal spray Place 1 spray into both nostrils daily. 12/15/18   Petrucelli, Samantha R, PA-C  loratadine (CLARITIN) 10 MG tablet Take 10 mg by mouth daily as needed for allergies.     [provider]  pantoprazole (PROTONIX) 40 MG tablet Take 40 mg by mouth daily.     [provider]  sertraline (ZOLOFT) 25 MG tablet Take 1 tablet (25 mg total) by mouth daily. 05/06/21   Dione Booze, MD      Allergies    Shellfish allergy    Review of Systems   Review of Systems  Physical Exam Updated Vital Signs BP 127/84 (BP Location: Right Arm)    Pulse 88    Temp (!) 97.5 F (36.4 C) (Oral)    Resp 17    Ht 6'  3.5" (1.918 m)    Wt 120.2 kg    SpO2 99%    BMI 32.69 kg/m  Physical Exam Vitals and nursing note reviewed.  Constitutional:      Appearance: He is well-developed.  HENT:     Head: Normocephalic and atraumatic.     Mouth/Throat:     Mouth: Mucous membranes are moist. No injury or lacerations.     Dentition: Gingival swelling and dental caries present. No dental abscesses.     Pharynx: Oropharynx is clear.  Eyes:     Pupils: Pupils are equal, round, and reactive to light.  Cardiovascular:     Rate and Rhythm: Normal rate.  Pulmonary:     Effort: Pulmonary effort is normal. No respiratory distress.  Abdominal:     General: Abdomen is flat. There is no distension.  Musculoskeletal:        General: Normal range of motion.     Cervical back: Normal range of motion.  Skin:    General: Skin is warm and dry.  Neurological:     General: No focal deficit present.     Mental Status: He is alert.    ED Results / Procedures / Treatments   Labs (all labs ordered are listed, but only abnormal results are  displayed) Labs Reviewed - No data to display  EKG None  Radiology No results found.  Procedures Procedures    Medications Ordered in ED Medications  ketorolac (TORADOL) injection 30 mg (30 mg Intramuscular Given 05/22/21 0455)  cephALEXin (KEFLEX) capsule 1,000 mg (1,000 mg Oral Given 05/22/21 0454)    ED Course/ Medical Decision Making/ A&P                           Medical Decision Making Risk Prescription drug management.   No obvious deep neck space infection or exposed pulp on exam. Toradol/keflex here, rx for keflex at home. Dental resources provided.   Final Clinical Impression(s) / ED Diagnoses Final diagnoses:  Pain, dental    Rx / DC Orders ED Discharge Orders          Ordered    cephALEXin (KEFLEX) 500 MG capsule  4 times daily        05/22/21 0428              Roosevelt Eimers, Barbara Cower, MD 05/22/21 [phone]

## 2021-05-22 NOTE — ED Triage Notes (Signed)
Pt reports "top and bottom" toothache that started last week; multiple broken teeth

## 2021-07-07 DIAGNOSIS — F332 Major depressive disorder, recurrent severe without psychotic features: Secondary | ICD-10-CM | POA: Insufficient documentation

## 2021-07-08 ENCOUNTER — Ambulatory Visit (HOSPITAL_COMMUNITY)
Admission: EM | Admit: 2021-07-08 | Discharge: 2021-07-08 | Disposition: A | Discharge status: Home / Self Care | Payer: Self-pay | Attending: Urology | Admitting: Urology

## 2021-07-08 DIAGNOSIS — F332 Major depressive disorder, recurrent severe without psychotic features: Secondary | ICD-10-CM

## 2021-07-08 MED ORDER — HYDROXYZINE HCL 25 MG PO TABS: 25.0000 mg | ORAL_TABLET | Freq: Three times a day (TID) | ORAL | 1 refills | Status: DC | PRN

## 2021-07-08 MED ORDER — TRAZODONE HCL 50 MG PO TABS: 50.0000 mg | ORAL_TABLET | Freq: Every evening | ORAL | 2 refills | Status: DC | PRN

## 2021-07-08 MED ORDER — PAROXETINE HCL 20 MG PO TABS: 20.0000 mg | ORAL_TABLET | Freq: Every day | ORAL | 2 refills | Status: DC

## 2021-07-08 NOTE — Discharge Instructions (Addendum)
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
## 2021-07-08 NOTE — ED Notes (Signed)
Pt discharged in no acute distress. Denied SI/HI/AVH. A&O x4, ambulatory. Verbalized understanding of AVS instructions reviewed by RN. NP provided prescriptions. Belongings returned to pt and his mother intact from black locker. Pt escorted to front lobby by staff. Safety maintained.  ?

## 2021-07-08 NOTE — Progress Notes (Signed)
TRIAGE: URGENT ? ? 07/07/21 2310  ?[place] Triage Screening (Walk-ins at [facility] only)  ?How Did You Hear About [place]? Family/Friend  ?What Is the Reason for Your Visit/Call Today? Pt states, "The stress is just getting to me. I can't do it no more. Everybody puts me down. They tried to put me on medicine but they didn't help. I need to get some help." Pt denies he's currently experiencing SI but acknowledges he's experiened SI in the past; he states that in 2016 he got a gun and planned to shoot himself with it, holding it up to his head before stopping himself. Pt shares he's been hospitalized for mental health concerns in the past, stating he was at [facility] in [place]. Pt denies he currently has a plan to kill himself. Pt denies HI, VH, NSSIB, or engagement with the legal system. Pt endorses talking to his grandfather who died 17 years ago. Pt endorses gun ownership, stating he has them in a safe with a key. 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 He states that he drank a 1/5 of liquor tonight. Pt shares that he shakes when he doesn't drink and that he dried to quit drinking last week and passed out 2x.  ?How Long Has This Been Causing You Problems? > than 6 months  ?Have You Recently Had Any Thoughts About Hurting Yourself? Yes  ?How long ago did you have thoughts about hurting yourself? Unsure  ?Are You Planning to Commit Suicide/Harm Yourself At This time? No  ?Have you Recently Had Thoughts About [place]? No  ?Are You Planning To Harm Someone At This Time? No  ?Are you currently experiencing any auditory, visual or other hallucinations? No  ?Have You Used Any Alcohol or Drugs in the Past 24 Hours? Yes  ?How long ago did you use Drugs or Alcohol? Pt states he drank 1/5 of liquor tonight  ?What Did You Use and How Much? Pt states he drank 1/5 of liquor tonight  ?Do you have any current medical  co-morbidities that require immediate attention? No  ?Clinician description of patient physical appearance/behavior: Pt is dressed in work Nurse, mental health. He is tearful throughout the triage. Pt utilizes open communication to express his thoughts, feelings, and concerns.  ?What Do You Feel Would Help You the Most Today? Alcohol or Drug Use Treatment;Treatment for Depression or other mood problem;Medication(s)  ?If access to [facility] was not available, would you have sought care in the Emergency Department? Yes  ?Determination of Need Urgent (48 hours)  ?Options For Referral Facility-Based Crisis;Outpatient Therapy;Medication Management  ? ? ?

## 2021-09-30 ENCOUNTER — Encounter (HOSPITAL_COMMUNITY): Payer: Self-pay

## 2021-09-30 ENCOUNTER — Emergency Department (HOSPITAL_COMMUNITY)
Admission: EM | Admit: 2021-09-30 | Discharge: 2021-09-30 | Disposition: A | Discharge status: Home / Self Care | Payer: Self-pay | Attending: Emergency Medicine | Admitting: Emergency Medicine

## 2021-09-30 ENCOUNTER — Other Ambulatory Visit: Payer: Self-pay

## 2021-09-30 DIAGNOSIS — Z7952 Long term (current) use of systemic steroids: Secondary | ICD-10-CM | POA: Insufficient documentation

## 2021-09-30 DIAGNOSIS — J45909 Unspecified asthma, uncomplicated: Secondary | ICD-10-CM | POA: Insufficient documentation

## 2021-09-30 DIAGNOSIS — Z7951 Long term (current) use of inhaled steroids: Secondary | ICD-10-CM | POA: Insufficient documentation

## 2021-09-30 DIAGNOSIS — U071 COVID-19: Secondary | ICD-10-CM | POA: Insufficient documentation

## 2021-09-30 LAB — SARS CORONAVIRUS 2 BY RT PCR: SARS Coronavirus 2 by RT PCR: POSITIVE — AB

## 2021-09-30 MED ORDER — ACETAMINOPHEN 325 MG PO TABS
650.0000 mg | ORAL_TABLET | Freq: Once | ORAL | Status: AC | PRN
  Administered 2021-09-30: 650 mg via ORAL
  Filled 2021-09-30: qty 2

## 2021-09-30 MED ORDER — NIRMATRELVIR/RITONAVIR (PAXLOVID)TABLET: 3.0000 | ORAL_TABLET | Freq: Two times a day (BID) | ORAL | 0 refills | Status: AC

## 2021-09-30 MED ORDER — PREDNISONE 20 MG PO TABS: 40.0000 mg | ORAL_TABLET | Freq: Every day | ORAL | 0 refills | Status: DC

## 2021-09-30 MED ORDER — PREDNISONE 50 MG PO TABS
60.0000 mg | ORAL_TABLET | Freq: Once | ORAL | Status: AC
  Administered 2021-09-30: 60 mg via ORAL
  Filled 2021-09-30: qty 1

## 2021-09-30 MED ORDER — ALBUTEROL SULFATE HFA 108 (90 BASE) MCG/ACT IN AERS
2.0000 | INHALATION_SPRAY | RESPIRATORY_TRACT | Status: DC | PRN
  Administered 2021-09-30: 2 via RESPIRATORY_TRACT
  Filled 2021-09-30: qty 6.7

## 2021-09-30 NOTE — ED Provider Notes (Signed)
[facility] EMERGENCY DEPARTMENT Provider Note   CSN: [id number] Arrival date & time: 09/30/21  1925     History  Chief Complaint  Patient presents with   Fever    Tracy Schroeder is a 31 y.o. male.  Patient presents to the emergency department for evaluation of fever, chills, generalized body aches, malaise, headache, runny nose and slight cough.  Symptoms began yesterday.       Home Medications Prior to Admission medications   Medication Sig Start Date End Date Taking? Authorizing Provider  nirmatrelvir/ritonavir EUA (PAXLOVID) 20 x 150 MG & 10 x 100MG  TABS Take 3 tablets by mouth 2 (two) times daily for 5 days. Patient GFR is normal. Take nirmatrelvir (150 mg) two tablets twice daily for 5 days and ritonavir (100 mg) one tablet twice daily for 5 days. 09/30/21 10/05/21 Yes Alexy Heldt, 10/07/21, MD  predniSONE (DELTASONE) 20 MG tablet Take 2 tablets (40 mg total) by mouth daily with breakfast. 09/30/21  Yes Shilee Biggs, 10/02/21, MD  fluticasone (FLONASE) 50 MCG/ACT nasal spray Place 1 spray into both nostrils daily. 12/15/18   Petrucelli, Samantha R, PA-C  hydrOXYzine (ATARAX) 25 MG tablet Take 1 tablet (25 mg total) by mouth 3 (three) times daily as needed for anxiety. 07/08/21   Ajibola, Ene A, NP  loratadine (CLARITIN) 10 MG tablet Take 10 mg by mouth daily as needed for allergies.     [provider]  pantoprazole (PROTONIX) 40 MG tablet Take 40 mg by mouth daily.     [provider]  PARoxetine (PAXIL) 20 MG tablet Take 1 tablet (20 mg total) by mouth daily. 07/08/21 07/08/22  07/10/22 A, NP  traZODone (DESYREL) 50 MG tablet Take 1 tablet (50 mg total) by mouth at bedtime as needed for sleep. 07/08/21   07/10/21 A, NP      Allergies    Shellfish allergy    Review of Systems   Review of Systems  Physical Exam Updated Vital Signs BP (!) 120/56   Pulse 77   Temp 98.8 F (37.1 C) (Oral)   Resp 18   Ht 6\' 4"  (1.93 m)   Wt 107 kg   SpO2 98%    BMI 28.73 kg/m  Physical Exam Vitals and nursing note reviewed.  Constitutional:      General: He is not in acute distress.    Appearance: He is well-developed.  HENT:     Head: Normocephalic and atraumatic.     Mouth/Throat:     Mouth: Mucous membranes are moist.  Eyes:     General: Vision grossly intact. Gaze aligned appropriately.     Extraocular Movements: Extraocular movements intact.     Conjunctiva/sclera: Conjunctivae normal.  Cardiovascular:     Rate and Rhythm: Normal rate and regular rhythm.     Pulses: Normal pulses.     Heart sounds: Normal heart sounds, S1 normal and S2 normal. No murmur heard.    No friction rub. No gallop.  Pulmonary:     Effort: Pulmonary effort is normal. No respiratory distress.     Breath sounds: Normal breath sounds.  Abdominal:     Palpations: Abdomen is soft.     Tenderness: There is no abdominal tenderness. There is no guarding or rebound.     Hernia: No hernia is present.  Musculoskeletal:        General: No swelling.     Cervical back: Full passive range of motion without pain, normal range of motion and neck  supple. No pain with movement, spinous process tenderness or muscular tenderness. Normal range of motion.     Right lower leg: No edema.     Left lower leg: No edema.  Skin:    General: Skin is warm and dry.     Capillary Refill: Capillary refill takes less than 2 seconds.     Findings: No ecchymosis, erythema, lesion or wound.  Neurological:     Mental Status: He is alert and oriented to person, place, and time.     GCS: GCS eye subscore is 4. GCS verbal subscore is 5. GCS motor subscore is 6.     Cranial Nerves: Cranial nerves 2-12 are intact.     Sensory: Sensation is intact.     Motor: Motor function is intact. No weakness or abnormal muscle tone.     Coordination: Coordination is intact.  Psychiatric:        Mood and Affect: Mood normal.        Speech: Speech normal.        Behavior: Behavior normal.     ED Results  / Procedures / Treatments   Labs (all labs ordered are listed, but only abnormal results are displayed) Labs Reviewed  SARS CORONAVIRUS 2 BY RT PCR - Abnormal; Notable for the following components:      Result Value   SARS Coronavirus 2 by RT PCR POSITIVE (*)    All other components within normal limits    EKG None  Radiology No results found.  Procedures Procedures    Medications Ordered in ED Medications  predniSONE (DELTASONE) tablet 60 mg (has no administration in time range)  albuterol (VENTOLIN HFA) 108 (90 Base) MCG/ACT inhaler 2 puff (has no administration in time range)  acetaminophen (TYLENOL) tablet 650 mg (650 mg Oral Given 09/30/21 1954)    ED Course/ Medical Decision Making/ A&P                           Medical Decision Making Risk OTC drugs.   Presents to the emergency department for viral illness symptoms.  He was febrile to 103.1.  Patient did test positive for COVID.  He does have a history of asthma and therefore will prescribe Paxlovid.  He is not currently having asthma symptoms.  Will empirically put him on prednisone and provide him an inhaler.  Given return precautions.  Given quarantine instructions.        Final Clinical Impression(s) / ED Diagnoses Final diagnoses:  COVID-19    Rx / DC Orders ED Discharge Orders          Ordered    predniSONE (DELTASONE) 20 MG tablet  Daily with breakfast        09/30/21 2337    nirmatrelvir/ritonavir EUA (PAXLOVID) 20 x 150 MG & 10 x 100MG  TABS  2 times daily        09/30/21 2337              2338, MD 09/30/21 2338

## 2021-09-30 NOTE — ED Triage Notes (Signed)
Pt presents with subjective fever, HA, body aches, runny nose. Symptoms started yesterday evening.

## 2022-01-22 ENCOUNTER — Other Ambulatory Visit: Payer: Self-pay

## 2022-01-22 ENCOUNTER — Encounter (HOSPITAL_BASED_OUTPATIENT_CLINIC_OR_DEPARTMENT_OTHER): Payer: Self-pay | Admitting: Emergency Medicine

## 2022-01-22 ENCOUNTER — Emergency Department (HOSPITAL_BASED_OUTPATIENT_CLINIC_OR_DEPARTMENT_OTHER)
Admission: EM | Admit: 2022-01-22 | Discharge: 2022-01-22 | Disposition: A | Discharge status: Home / Self Care | Payer: 59 | Attending: Emergency Medicine | Admitting: Emergency Medicine

## 2022-01-22 DIAGNOSIS — K047 Periapical abscess without sinus: Secondary | ICD-10-CM | POA: Insufficient documentation

## 2022-01-22 DIAGNOSIS — K0889 Other specified disorders of teeth and supporting structures: Secondary | ICD-10-CM | POA: Diagnosis present

## 2022-01-22 DIAGNOSIS — K029 Dental caries, unspecified: Secondary | ICD-10-CM | POA: Insufficient documentation

## 2022-01-22 MED ORDER — IBUPROFEN 400 MG PO TABS
600.0000 mg | ORAL_TABLET | Freq: Once | ORAL | Status: AC
  Administered 2022-01-22: 600 mg via ORAL
  Filled 2022-01-22: qty 1

## 2022-01-22 MED ORDER — AMOXICILLIN-POT CLAVULANATE 875-125 MG PO TABS
1.0000 | ORAL_TABLET | Freq: Once | ORAL | Status: AC
  Administered 2022-01-22: 1 via ORAL
  Filled 2022-01-22: qty 1

## 2022-01-22 MED ORDER — IBUPROFEN 600 MG PO TABS: 600.0000 mg | ORAL_TABLET | Freq: Four times a day (QID) | ORAL | 0 refills | Status: DC | PRN

## 2022-01-22 MED ORDER — CHLORHEXIDINE GLUCONATE 0.12 % MT SOLN: 15.0000 mL | Freq: Two times a day (BID) | OROMUCOSAL | 0 refills | Status: DC

## 2022-01-22 MED ORDER — AMOXICILLIN-POT CLAVULANATE 875-125 MG PO TABS
1.0000 | ORAL_TABLET | Freq: Two times a day (BID) | ORAL | 0 refills | Status: DC
Start: 2022-01-22 — End: 2022-01-26

## 2022-01-22 NOTE — ED Triage Notes (Signed)
Right side dental paint since 10/9 am, Hx of same.

## 2022-01-22 NOTE — ED Provider Notes (Signed)
[facility] EMERGENCY DEPARTMENT Provider Note   CSN: [id number] Arrival date & time: 01/22/22  0051     History  Chief Complaint  Patient presents with   Dental Pain    Tracy Schroeder is a 31 y.o. male.  The history is provided by the patient.  Dental Pain Tracy Schroeder is a 31 y.o. male who presents to the Emergency Department complaining of dental pain.  He presents to the ED for evaluation of right upper dental pain that started yesterday morning.  Pain now spreads to the right lower face as well.  Has facial swelling.    No reported fevers but felt hot earlier.  No N/V.  No difficulty swallowing.    No medical problems.  No routine medications.  Does not have a dentist.  He has experienced similar to the past about 4 months ago that was treated with antibiotics.     Home Medications Prior to Admission medications   Medication Sig Start Date End Date Taking? Authorizing Provider  amoxicillin-clavulanate (AUGMENTIN) 875-125 MG tablet Take 1 tablet by mouth every 12 (twelve) hours. 01/22/22  Yes Tilden Fossa, MD  chlorhexidine (PERIDEX) 0.12 % solution Use as directed 15 mLs in the mouth or throat 2 (two) times daily. Swish and spit 01/22/22  Yes Tilden Fossa, MD  ibuprofen (ADVIL) 600 MG tablet Take 1 tablet (600 mg total) by mouth every 6 (six) hours as needed. 01/22/22  Yes Tilden Fossa, MD  fluticasone [facility]) 50 MCG/ACT nasal spray Place 1 spray into both nostrils daily. 12/15/18   Petrucelli, Samantha R, PA-C  hydrOXYzine (ATARAX) 25 MG tablet Take 1 tablet (25 mg total) by mouth 3 (three) times daily as needed for anxiety. 07/08/21   Ajibola, Ene A, NP  loratadine (CLARITIN) 10 MG tablet Take 10 mg by mouth daily as needed for allergies.     [provider]  pantoprazole (PROTONIX) 40 MG tablet Take 40 mg by mouth daily.     [provider]  PARoxetine (PAXIL) 20 MG tablet Take 1 tablet (20 mg total) by mouth daily. 07/08/21 07/08/22   Cecilio Asper A, NP  predniSONE (DELTASONE) 20 MG tablet Take 2 tablets (40 mg total) by mouth daily with breakfast. 09/30/21   Pollina, Canary Brim, MD  traZODone (DESYREL) 50 MG tablet Take 1 tablet (50 mg total) by mouth at bedtime as needed for sleep. 07/08/21   Maricela Bo, NP      Allergies    Shellfish allergy    Review of Systems   Review of Systems  All other systems reviewed and are negative.   Physical Exam Updated Vital Signs BP (!) 135/92 (BP Location: Right Arm)   Pulse 70   Temp 98.1 F (36.7 C) (Oral)   Resp 18   Ht 6\' 4"  (1.93 m)   Wt 127 kg   SpO2 100%   BMI 34.08 kg/m  Physical Exam Vitals and nursing note reviewed.  Constitutional:      Appearance: He is well-developed.  HENT:     Head: Normocephalic and atraumatic.     Comments: No significant facial swelling.  Extensive dental carries with gingival erythema and induration on the right upper jaw, maximal tenderness over tooth #7.  Multiple teeth are broken.  No palpable fluid collection.  No sublingual edema or erythema Cardiovascular:     Rate and Rhythm: Normal rate and regular rhythm.  Pulmonary:     Effort: Pulmonary effort is normal. No respiratory distress.  Musculoskeletal:  General: No tenderness.     Cervical back: Neck supple.  Lymphadenopathy:     Cervical: No cervical adenopathy.  Skin:    General: Skin is warm and dry.  Neurological:     Mental Status: He is alert and oriented to person, place, and time.  Psychiatric:        Behavior: Behavior normal.     ED Results / Procedures / Treatments   Labs (all labs ordered are listed, but only abnormal results are displayed) Labs Reviewed - No data to display  EKG None  Radiology No results found.  Procedures Procedures    Medications Ordered in ED Medications  amoxicillin-clavulanate (AUGMENTIN) 875-125 MG per tablet 1 tablet (has no administration in time range)  ibuprofen (ADVIL) tablet 600 mg (has no  administration in time range)    ED Course/ Medical Decision Making/ A&P                           Medical Decision Making Risk Prescription drug management.   Patient here for evaluation of dental pain.  He has extensive caries on examination with gingival disease.  No palpable drainable abscess.  He is nontoxic-appearing on evaluation.  Discussed with patient likely early dental abscess.  We will start antibiotics.  We will also start Peridex mouthwash for gingivitis.  Discussed dentistry follow-up as well as return precautions for evidence of progressive infection.        Final Clinical Impression(s) / ED Diagnoses Final diagnoses:  Dental abscess  Pain due to dental caries    Rx / DC Orders ED Discharge Orders          Ordered    amoxicillin-clavulanate (AUGMENTIN) 875-125 MG tablet  Every 12 hours        01/22/22 0204    chlorhexidine (PERIDEX) 0.12 % solution  2 times daily        01/22/22 0204    ibuprofen (ADVIL) 600 MG tablet  Every 6 hours PRN        01/22/22 0204              Quintella Reichert, MD 01/22/22 0206

## 2022-01-26 ENCOUNTER — Other Ambulatory Visit: Payer: Self-pay

## 2022-01-26 ENCOUNTER — Emergency Department (HOSPITAL_BASED_OUTPATIENT_CLINIC_OR_DEPARTMENT_OTHER)
Admission: EM | Admit: 2022-01-26 | Discharge: 2022-01-26 | Disposition: A | Discharge status: Home / Self Care | Payer: 59 | Attending: Emergency Medicine | Admitting: Emergency Medicine

## 2022-01-26 ENCOUNTER — Encounter (HOSPITAL_BASED_OUTPATIENT_CLINIC_OR_DEPARTMENT_OTHER): Payer: Self-pay | Admitting: Emergency Medicine

## 2022-01-26 DIAGNOSIS — K029 Dental caries, unspecified: Secondary | ICD-10-CM | POA: Diagnosis not present

## 2022-01-26 DIAGNOSIS — Z79899 Other long term (current) drug therapy: Secondary | ICD-10-CM | POA: Diagnosis not present

## 2022-01-26 DIAGNOSIS — K0889 Other specified disorders of teeth and supporting structures: Secondary | ICD-10-CM | POA: Diagnosis present

## 2022-01-26 DIAGNOSIS — F17210 Nicotine dependence, cigarettes, uncomplicated: Secondary | ICD-10-CM | POA: Diagnosis not present

## 2022-01-26 DIAGNOSIS — J45909 Unspecified asthma, uncomplicated: Secondary | ICD-10-CM | POA: Diagnosis not present

## 2022-01-26 MED ORDER — HYDROCODONE-ACETAMINOPHEN 5-325 MG PO TABS: 1.0000 | ORAL_TABLET | ORAL | 0 refills | Status: DC | PRN

## 2022-01-26 MED ORDER — HYDROCODONE-ACETAMINOPHEN 5-325 MG PO TABS
2.0000 | ORAL_TABLET | Freq: Once | ORAL | Status: AC
  Administered 2022-01-26: 2 via ORAL
  Filled 2022-01-26: qty 2

## 2022-01-26 MED ORDER — AMOXICILLIN-POT CLAVULANATE 875-125 MG PO TABS
1.0000 | ORAL_TABLET | Freq: Two times a day (BID) | ORAL | 0 refills | Status: DC
Start: 2022-01-26 — End: 2023-03-02

## 2022-01-26 NOTE — ED Triage Notes (Signed)
Pt is c/o dental pain on the right side  Pt was seen here earlier this week and states the medication is not helping

## 2022-01-26 NOTE — ED Provider Notes (Signed)
[facility] Provider Note: Tracy Dell, MD, FACEP  CSN: [id number] MRN: [id number] ARRIVAL: 01/26/22 at 0231 ROOM: MH06/MH06   CHIEF COMPLAINT  Dental Pain   HISTORY OF PRESENT ILLNESS  01/26/22 2:49 AM Tracy Schroeder is a 31 y.o. male who was seen on 01/22/2022 for right upper dental pain that had started the day before.  He had associated maxillary and mandibular right-sided facial swelling.  He was treated with Augmentin, ibuprofen and chlorhexidine gluconate mouthwash.  He returns stating he has not gotten any relief and his pain is a 10 out of 10.  Pain is worse with eating or drinking.  He has had improvement in the mandibular swelling but states the maxillary swelling is still present and seems to get worse every time he takes ibuprofen.  He is wondering if he is allergic to the ibuprofen.  He has not been able to arrange a dental appointment until this January 7.   Past Medical History:  Diagnosis Date   Anxiety    Asthma    GERD (gastroesophageal reflux disease)    Panic attacks     History reviewed. No pertinent surgical history.  Family History  Problem Relation Age of Onset   Hyperlipidemia Mother    Thyroid disease Mother    Asthma Mother    Hyperlipidemia Father    Hypertension Father    COPD Father     Social History   Tobacco Use   Smoking status: Every Day    Packs/day: 0.50    Types: Cigarettes   Smokeless tobacco: Current  Vaping Use   Vaping Use: Never used  Substance Use Topics   Alcohol use: Yes    Comment: occ   Drug use: No    Prior to Admission medications   Medication Sig Start Date End Date Taking? Authorizing Provider  HYDROcodone-acetaminophen (NORCO) 5-325 MG tablet Take 1 tablet by mouth every 4 (four) hours as needed for severe pain. 01/26/22  Yes Shahrzad Koble, MD  amoxicillin-clavulanate (AUGMENTIN) 875-125 MG tablet Take 1 tablet by mouth every 12 (twelve) hours. 01/26/22   Manasa Spease, MD  chlorhexidine  (PERIDEX) 0.12 % solution Use as directed 15 mLs in the mouth or throat 2 (two) times daily. Swish and spit 01/22/22   Tilden Fossa, MD  hydrOXYzine (ATARAX) 25 MG tablet Take 1 tablet (25 mg total) by mouth 3 (three) times daily as needed for anxiety. 07/08/21   Ajibola, Ene A, NP  loratadine (CLARITIN) 10 MG tablet Take 10 mg by mouth daily as needed for allergies.     [provider]  pantoprazole (PROTONIX) 40 MG tablet Take 40 mg by mouth daily.     [provider]  PARoxetine (PAXIL) 20 MG tablet Take 1 tablet (20 mg total) by mouth daily. 07/08/21 07/08/22  Cecilio Asper A, NP  traZODone (DESYREL) 50 MG tablet Take 1 tablet (50 mg total) by mouth at bedtime as needed for sleep. 07/08/21   Cecilio Asper A, NP    Allergies Shellfish allergy   REVIEW OF SYSTEMS  Negative except as noted here or in the History of Present Illness.   PHYSICAL EXAMINATION  Initial Vital Signs Blood pressure 133/82, pulse 90, temperature 97.6 F (36.4 C), temperature source Oral, resp. rate 18, height 6\' 4"  (1.93 m), weight 129.3 kg, SpO2 99 %.  Examination General: Well-developed, well-nourished male in no acute distress; appearance consistent with age of record HENT: normocephalic; atraumatic; widespread dental decay; right maxillary soft tissue swelling with  tenderness to palpation Eyes: Normal appearance Neck: supple Heart: regular rate and rhythm Lungs: clear to auscultation bilaterally Abdomen: soft; nondistended; nontender; bowel sounds present Extremities: No deformity; full range of motion Neurologic: Awake, alert and oriented; motor function intact in all extremities and symmetric; no facial droop Skin: Warm and dry Psychiatric: Flat affect   RESULTS  Summary of this visit's results, reviewed and interpreted by myself:   EKG Interpretation  Date/Time:    Ventricular Rate:    PR Interval:    QRS Duration:   QT Interval:    QTC Calculation:   R Axis:     Text  Interpretation:         Laboratory Studies: No results found for this or any previous visit (from the past 24 hour(s)). Imaging Studies: No results found.  ED COURSE and MDM  Nursing notes, initial and subsequent vitals signs, including pulse oximetry, reviewed and interpreted by myself.  Vitals:   01/26/22 0240  BP: 133/82  Pulse: 90  Resp: 18  Temp: 97.6 F (36.4 C)  TempSrc: Oral  SpO2: 99%  Weight: 129.3 kg  Height: 6\' 4"  (1.93 m)   Medications  HYDROcodone-acetaminophen (NORCO/VICODIN) 5-325 MG per tablet 2 tablet (has no administration in time range)    The patient has widespread dental decay acute difficult to identify ED1 particular tooth as a source of this likely odontogenic infection.  We will have him continue the Augmentin and treat him with a short course of narcotic analgesia and refer to the dentist on-call.  PROCEDURES  Procedures   ED DIAGNOSES     ICD-10-CM   1. Pain, dental  K08.89     2. Dental caries  K02.9          Tavius Turgeon, MD 01/26/22 [phone]

## 2022-03-24 ENCOUNTER — Ambulatory Visit (HOSPITAL_COMMUNITY)
Admission: EM | Admit: 2022-03-24 | Discharge: 2022-03-24 | Disposition: A | Discharge status: Home / Self Care | Payer: 59 | Attending: Urology | Admitting: Urology

## 2022-03-24 MED ORDER — THIAMINE HCL 100 MG/ML IJ SOLN: 100.0000 mg | Freq: Once | INTRAMUSCULAR | Status: DC

## 2022-03-24 MED ORDER — ADULT MULTIVITAMIN W/MINERALS CH: 1.0000 | ORAL_TABLET | Freq: Every day | ORAL | Status: DC

## 2022-03-24 MED ORDER — HYDROXYZINE HCL 25 MG PO TABS: 25.0000 mg | ORAL_TABLET | Freq: Four times a day (QID) | ORAL | Status: DC | PRN

## 2022-03-24 MED ORDER — CHLORDIAZEPOXIDE HCL 25 MG PO CAPS: 25.0000 mg | ORAL_CAPSULE | Freq: Four times a day (QID) | ORAL | Status: DC | PRN

## 2022-03-24 MED ORDER — ONDANSETRON 4 MG PO TBDP: 4.0000 mg | ORAL_TABLET | Freq: Four times a day (QID) | ORAL | Status: DC | PRN

## 2022-03-24 MED ORDER — CHLORDIAZEPOXIDE HCL 25 MG PO CAPS: ORAL_CAPSULE | ORAL | 0 refills | Status: AC

## 2022-03-24 MED ORDER — LOPERAMIDE HCL 2 MG PO CAPS: 2.0000 mg | ORAL_CAPSULE | ORAL | Status: DC | PRN

## 2022-03-24 NOTE — ED Notes (Signed)
Patient demanding to leave - verified with provider that he can go. Resources printed and given to patient - patient walked out with no sxs of distress

## 2022-03-24 NOTE — ED Provider Notes (Signed)
Behavioral Health Urgent Care Medical Screening Exam  Patient Name: Tracy Schroeder MRN: [id number] Date of Evaluation: 03/24/22 Chief Complaint:   Diagnosis:  Final diagnoses:  None    History of Present illness: Tracy Schroeder is a 31 y.o. male. ***  Flowsheet Row ED from 03/24/2022 in [facility] ED from 01/26/2022 in [facility] EMERGENCY DEPARTMENT ED from 01/22/2022 in [facility] EM[facility] DEPARTMENT  C-SSRS RISK CATEGORY No Risk No Risk No Risk       Psychiatric Specialty Exam  Presentation  General Appearance:Casual  Eye Contact:Good  Speech:Clear and Coherent  Speech Volume:Normal  Handedness:Right   Mood and Affect  Mood: Depressed  Affect: Congruent   Thought Process  Thought Processes: Coherent  Descriptions of Associations:Intact  Orientation:Full (Time, Place and Person)  Thought Content:WDL    Hallucinations:None  Ideas of Reference:None  Suicidal Thoughts:No  Homicidal Thoughts:No   Sensorium  Memory: Immediate Good; Recent Good; Remote Fair  Judgment: Fair  Insight: Fair   Art therapist  Concentration: Good  Attention Span: Good  Recall: Good  Fund of Knowledge: Good  Language: Good   Psychomotor Activity  Psychomotor Activity: Normal   Assets  Assets: Communication Skills; Housing; Social Support; Vocational/Educational   Sleep  Sleep: Poor  Number of hours:  6   No data recorded  Physical Exam: Physical Exam ROS Blood pressure 126/84, pulse 87, temperature 98 F (36.7 C), temperature source Oral, resp. rate 18, SpO2 98 %. There is no height or weight on file to calculate BMI.  Musculoskeletal: Strength & Muscle Tone: {desc; muscle tone:32375} Gait & Station: {PE GAIT ED NATL:22525} Patient leans: {Patient Leans:21022755}   [facility] MSE Discharge Disposition for Follow up and Recommendations: {[facility] MSE Recommendations:24277}   Tracy Schroeder A  Tracy Mofield, NP 03/24/2022, 11:57 PM

## 2022-03-24 NOTE — Progress Notes (Signed)
   03/24/22 2200  [facility] Triage Screening (Walk-ins at [facility] only)  What Is the Reason for Your Visit/Call Today? Pt is requesting treatment for substance use. He endorses drinking alcohol since age 31 and is currently drinking 1 case of beer daily. He says when he stops drinking he experiences withdrawal symptoms including tremors, sweats, and decreased eating. He denies history of seizures. 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 He says he had stopped using for six months, resumed using a year ago, and told his mother today he was using again. He also report using legal cannabis oil and smoking 2 packs of cigarettes daily. He says he has high anxiety and has been on psychiatric medications (hydroxyzine, Paxil, Trazodone) but they are not effective. He says he deals with stress with substances. He states his cousin died by suicide 1 month ago, his father has stage 4 lung cancer, and his mother is filing for bankruptcy. He denies current suicidal ideation, homicidal ideation, or psychotic symptoms. He says he cannot afford to miss work and is interested in outpatient substance abuse treatment.  How Long Has This Been Causing You Problems? > than 6 months  Have You Recently Had Any Thoughts About Hurting Yourself? No  Are You Planning to Commit Suicide/Harm Yourself At This time? No  Have you Recently Had Thoughts About Hurting Someone Karolee Ohs? No  Are You Planning To Harm Someone At This Time? No  Are you currently experiencing any auditory, visual or other hallucinations? No  Have You Used Any Alcohol or Drugs in the Past 24 Hours? Yes  How long ago did you use Drugs or Alcohol? Pt reports drinking 12 cans of beer today  What Did You Use and How Much? Pt reports drinking 12 cans of beer today  Do you have any current medical co-morbidities that require immediate attention? No  Clinician  description of patient physical appearance/behavior: Pt is casually dressed and smells of alcohol. He is alert and oriented x4. Pt speaks in a clear tone, at moderate volume and normal pace. Motor behavior appears normal. Eye contact is good. Pt's mood is anxious and affect is congruent with mood. Thought process is coherent and relevant. There is no indication he is currently responding to internal stimuli or experiencing delusional thought content. He is cooperative.  What Do You Feel Would Help You the Most Today? Alcohol or Drug Use Treatment  If access to [facility] Urgent Care was not available, would you have sought care in the Emergency Department? Yes  Determination of Need Routine (7 days)  Options For Referral Facility-Based Crisis;Chemical Dependency Intensive Outpatient Therapy (CDIOP)

## 2022-04-05 ENCOUNTER — Encounter (HOSPITAL_BASED_OUTPATIENT_CLINIC_OR_DEPARTMENT_OTHER): Payer: Self-pay

## 2022-04-05 ENCOUNTER — Other Ambulatory Visit: Payer: Self-pay

## 2022-04-05 ENCOUNTER — Emergency Department (HOSPITAL_BASED_OUTPATIENT_CLINIC_OR_DEPARTMENT_OTHER)
Admission: EM | Admit: 2022-04-05 | Discharge: 2022-04-05 | Disposition: A | Discharge status: Home / Self Care | Payer: 59 | Attending: Emergency Medicine | Admitting: Emergency Medicine

## 2022-04-05 DIAGNOSIS — R509 Fever, unspecified: Secondary | ICD-10-CM | POA: Diagnosis present

## 2022-04-05 DIAGNOSIS — Z20822 Contact with and (suspected) exposure to covid-19: Secondary | ICD-10-CM | POA: Insufficient documentation

## 2022-04-05 DIAGNOSIS — J101 Influenza due to other identified influenza virus with other respiratory manifestations: Secondary | ICD-10-CM | POA: Insufficient documentation

## 2022-04-05 LAB — RESP PANEL BY RT-PCR (RSV, FLU A&B, COVID)  RVPGX2
Influenza A by PCR: NEGATIVE
Influenza B by PCR: POSITIVE — AB
Resp Syncytial Virus by PCR: NEGATIVE
SARS Coronavirus 2 by RT PCR: NEGATIVE

## 2022-04-05 MED ORDER — ALBUTEROL SULFATE HFA 108 (90 BASE) MCG/ACT IN AERS: 2.0000 | INHALATION_SPRAY | RESPIRATORY_TRACT | Status: DC | PRN

## 2022-04-05 MED ORDER — IPRATROPIUM-ALBUTEROL 0.5-2.5 (3) MG/3ML IN SOLN
RESPIRATORY_TRACT | Status: AC
  Administered 2022-04-05: 3 mL via RESPIRATORY_TRACT
  Filled 2022-04-05: qty 3

## 2022-04-05 MED ORDER — AEROCHAMBER PLUS FLO-VU LARGE MISC
1.0000 | Freq: Once | Status: AC
  Administered 2022-04-05: 1
  Filled 2022-04-05: qty 1

## 2022-04-05 MED ORDER — IPRATROPIUM-ALBUTEROL 0.5-2.5 (3) MG/3ML IN SOLN: 3.0000 mL | Freq: Once | RESPIRATORY_TRACT | Status: AC

## 2022-04-05 MED ORDER — ALBUTEROL SULFATE HFA 108 (90 BASE) MCG/ACT IN AERS
INHALATION_SPRAY | RESPIRATORY_TRACT | Status: AC
  Administered 2022-04-05: 2 via RESPIRATORY_TRACT
  Filled 2022-04-05: qty 6.7

## 2022-04-05 NOTE — Discharge Instructions (Signed)
You were seen in the emergency department for your fever and cough.  You tested positive for the flu.  You have no signs of any pneumonia.  You are outside the window for treatment with Tamiflu so you should continue symptomatic treatment at home with Tylenol and Motrin as needed for fever or bodyaches.  You should make sure that you are drinking plenty of fluids.  You should follow-up with your primary doctor in the next few days to have your symptoms rechecked.  You should return to the emergency department for significantly worsening shortness of breath, repetitive vomiting, or if you have any other new or concerning symptoms.

## 2022-04-05 NOTE — ED Provider Notes (Signed)
[facility]ERGENCY DEPT Provider Note   CSN: [id number] Arrival date & time: 04/05/22  1957     History  Chief Complaint  Patient presents with   Fever    Tracy Schroeder is a 31 y.o. male.  Patient is a 31 year old male with no significant past medical history presenting to the emergency department with fever.  Patient states around 2 weeks ago he started to have rhinorrhea and over the last week has had increasing fevers, cough and bodyaches.  He states he has mild shortness of breath.  He denies any nausea, vomiting or diarrhea.  He denies any known sick contacts.  The history is provided by the patient.  Fever      Home Medications Prior to Admission medications   Medication Sig Start Date End Date Taking? Authorizing Provider  amoxicillin-clavulanate (AUGMENTIN) 875-125 MG tablet Take 1 tablet by mouth every 12 (twelve) hours. 01/26/22   Molpus, John, MD  chlorhexidine (PERIDEX) 0.12 % solution Use as directed 15 mLs in the mouth or throat 2 (two) times daily. Swish and spit 01/22/22   Quintella Reichert, MD  HYDROcodone-acetaminophen [facility]) 5-325 MG tablet Take 1 tablet by mouth every 4 (four) hours as needed for severe pain. 01/26/22   Molpus, John, MD  hydrOXYzine (ATARAX) 25 MG tablet Take 1 tablet (25 mg total) by mouth 3 (three) times daily as needed for anxiety. 07/08/21   Ajibola, Ene A, NP  loratadine (CLARITIN) 10 MG tablet Take 10 mg by mouth daily as needed for allergies.     [provider]  pantoprazole (PROTONIX) 40 MG tablet Take 40 mg by mouth daily.     [provider]  PARoxetine (PAXIL) 20 MG tablet Take 1 tablet (20 mg total) by mouth daily. 07/08/21 07/08/22  Leandro Reasoner A, NP  traZODone (DESYREL) 50 MG tablet Take 1 tablet (50 mg total) by mouth at bedtime as needed for sleep. 07/08/21   Ophelia Shoulder, NP      Allergies    Shellfish allergy    Review of Systems   Review of Systems  Constitutional:  Positive for  fever.    Physical Exam Updated Vital Signs BP 114/85   Pulse 93   Temp 98.4 F (36.9 C) (Oral)   Resp 18   Ht 6\' 4"  (1.93 m)   Wt 107 kg   SpO2 100%   BMI 28.73 kg/m  Physical Exam Vitals and nursing note reviewed.  Constitutional:      General: He is not in acute distress.    Appearance: Normal appearance. He is obese.  HENT:     Head: Normocephalic and atraumatic.     Nose: Congestion present.     Mouth/Throat:     Mouth: Mucous membranes are moist.     Pharynx: Oropharynx is clear.  Eyes:     Extraocular Movements: Extraocular movements intact.     Conjunctiva/sclera: Conjunctivae normal.  Cardiovascular:     Rate and Rhythm: Normal rate and regular rhythm.     Heart sounds: Normal heart sounds.  Pulmonary:     Effort: Pulmonary effort is normal.     Breath sounds: Normal breath sounds.  Abdominal:     General: Abdomen is flat.     Palpations: Abdomen is soft.     Tenderness: There is no abdominal tenderness.  Musculoskeletal:        General: Normal range of motion.     Cervical back: Normal range of motion and neck supple.  Skin:    General: Skin is warm and dry.  Neurological:     General: No focal deficit present.     Mental Status: He is alert and oriented to person, place, and time.  Psychiatric:        Mood and Affect: Mood normal.        Behavior: Behavior normal.     ED Results / Procedures / Treatments   Labs (all labs ordered are listed, but only abnormal results are displayed) Labs Reviewed  RESP PANEL BY RT-PCR (RSV, FLU A&B, COVID)  [id number] - Abnormal; Notable for the following components:      Result Value   Influenza B by PCR POSITIVE (*)    All other components within normal limits    EKG EKG Interpretation  Date/Time:  Friday April 05 2022 20:09:55 EST Ventricular Rate:  93 PR Interval:  154 QRS Duration: 92 QT Interval:  364 QTC Calculation: 452 R Axis:   75 Text Interpretation: Normal sinus rhythm Normal ECG No  significant change since last tracing Confirmed by Elayne Snare ([id number]) on 04/05/2022 10:18:40 PM  Radiology No results found.  Procedures Procedures    Medications Ordered in ED Medications  albuterol (VENTOLIN HFA) 108 (90 Base) MCG/ACT inhaler 2 puff (2 puffs Inhalation Given 04/05/22 2111)  AeroChamber Plus Flo-Vu Large MISC 1 each (1 each Other Given 04/05/22 2110)  ipratropium-albuterol (DUONEB) 0.5-2.5 (3) MG/3ML nebulizer solution 3 mL (3 mLs Nebulization Given 04/05/22 2110)    ED Course/ Medical Decision Making/ A&P                           Medical Decision Making This patient presents to the ED with chief complaint(s) of fever, cough with no pertinent past medical history which further complicates the presenting complaint. The complaint involves an extensive differential diagnosis and also carries with it a high risk of complications and morbidity.    The differential diagnosis includes viral syndrome, pneumonia unlikely as patient is satting well on room air and has no focal lung sounds, no signs of dehydration  Additional history obtained: Additional history obtained from N/A Records reviewed N/A  ED Course and Reassessment: Patient was initially evaluated by triage and had viral swab performed and he tested positive for influenza B.  Due to patient having symptoms for nearly 2 weeks now, he is outside the window for Tamiflu treatment.  He was recommended continue Tylenol and Motrin as needed for fevers and bodyaches and primary care follow-up.  He was given strict return precautions.  Independent labs interpretation:  The following labs were independently interpreted: Flu B positive  Independent visualization of imaging: N/A  Consultation: - Consulted or discussed management/test interpretation w/ external professional: N/A  Consideration for admission or further workup: Patient has no emergent conditions requiring admission or further work-up at this time  and is stable for discharge home with primary care follow-up  Social Determinants of health: N/A    Risk Prescription drug management.          Final Clinical Impression(s) / ED Diagnoses Final diagnoses:  Influenza B    Rx / DC Orders ED Discharge Orders     None         Rexford Maus, DO 04/05/22 2239

## 2022-04-05 NOTE — ED Notes (Signed)
Patient refusing to wait for paperwork. Educated patient on proper procedure and necessity of mask wearing. Patient states he doesn't care and does not want his paperwork.

## 2022-04-05 NOTE — ED Notes (Signed)
RT educated pt on proper use of MDI w/spacer, provided pt with education on smoking cessation, pt also expressed to RT he has never seen pulmonologist for evaluation of his asthma, RT provided information on PFT and pulmonology. Pt verbalizes understanding.

## 2022-04-05 NOTE — ED Triage Notes (Signed)
Pt presents with nasal drainage, fever, and cough x 1 week.

## 2022-04-09 ENCOUNTER — Telehealth (HOSPITAL_COMMUNITY): Payer: Self-pay | Admitting: Licensed Clinical Social Worker

## 2022-04-09 NOTE — Telephone Encounter (Signed)
The therapist attempts to reach [place] following his contact with the [facility] to see if he has gotten connected with aftercare; however, the therapist is unable to leave a messages as his voicemail box is full.  He reportedly has this therapist's contact information along with contact information for other [facility] providers such as the [facility].  Myrna Blazer, MA, LCSW, [facility], LCAS 04/09/2022

## 2022-04-11 ENCOUNTER — Other Ambulatory Visit: Payer: Self-pay

## 2022-04-11 ENCOUNTER — Encounter (HOSPITAL_BASED_OUTPATIENT_CLINIC_OR_DEPARTMENT_OTHER): Payer: Self-pay

## 2022-04-11 DIAGNOSIS — Z5321 Procedure and treatment not carried out due to patient leaving prior to being seen by health care provider: Secondary | ICD-10-CM | POA: Diagnosis not present

## 2022-04-11 DIAGNOSIS — R0602 Shortness of breath: Secondary | ICD-10-CM | POA: Insufficient documentation

## 2022-04-11 DIAGNOSIS — J101 Influenza due to other identified influenza virus with other respiratory manifestations: Secondary | ICD-10-CM | POA: Insufficient documentation

## 2022-04-11 MED ORDER — ALBUTEROL SULFATE HFA 108 (90 BASE) MCG/ACT IN AERS
2.0000 | INHALATION_SPRAY | RESPIRATORY_TRACT | Status: DC | PRN
Start: 2022-04-11 — End: 2022-04-12

## 2022-04-11 NOTE — ED Triage Notes (Signed)
Pt arrives to ED POV c/o [facility] that started today. Pt states he tested positive for Flu last week and was told to come back to the ED if he was feeling SHOB. No other complaints at this time.

## 2022-04-12 ENCOUNTER — Emergency Department (HOSPITAL_BASED_OUTPATIENT_CLINIC_OR_DEPARTMENT_OTHER)
Admission: EM | Admit: 2022-04-12 | Discharge: 2022-04-12 | Discharge status: Against Medical Advice | Payer: 59 | Attending: Emergency Medicine | Admitting: Emergency Medicine

## 2022-04-12 ENCOUNTER — Emergency Department (HOSPITAL_BASED_OUTPATIENT_CLINIC_OR_DEPARTMENT_OTHER): Payer: 59

## 2022-04-12 NOTE — ED Notes (Signed)
Called pt x 2 without answer from lobby

## 2022-05-23 DIAGNOSIS — K219 Gastro-esophageal reflux disease without esophagitis: Secondary | ICD-10-CM

## 2022-05-23 HISTORY — DX: Gastro-esophageal reflux disease without esophagitis: K21.9

## 2022-06-25 DIAGNOSIS — J302 Other seasonal allergic rhinitis: Secondary | ICD-10-CM

## 2022-06-25 HISTORY — DX: Other seasonal allergic rhinitis: J30.2

## 2022-07-26 ENCOUNTER — Other Ambulatory Visit: Payer: Self-pay

## 2022-07-26 ENCOUNTER — Emergency Department (HOSPITAL_COMMUNITY)
Admission: EM | Admit: 2022-07-26 | Discharge: 2022-07-26 | Disposition: A | Discharge status: Home / Self Care | Payer: 59 | Attending: Emergency Medicine | Admitting: Emergency Medicine

## 2022-07-26 ENCOUNTER — Emergency Department (HOSPITAL_COMMUNITY): Payer: 59

## 2022-07-26 DIAGNOSIS — W320XXA Accidental handgun discharge, initial encounter: Secondary | ICD-10-CM | POA: Insufficient documentation

## 2022-07-26 DIAGNOSIS — S99921A Unspecified injury of right foot, initial encounter: Secondary | ICD-10-CM | POA: Diagnosis present

## 2022-07-26 DIAGNOSIS — S91131A Puncture wound without foreign body of right great toe without damage to nail, initial encounter: Secondary | ICD-10-CM | POA: Diagnosis not present

## 2022-07-26 DIAGNOSIS — W3400XA Accidental discharge from unspecified firearms or gun, initial encounter: Secondary | ICD-10-CM

## 2022-07-26 MED ORDER — CEPHALEXIN 500 MG PO CAPS: 500.0000 mg | ORAL_CAPSULE | Freq: Two times a day (BID) | ORAL | 0 refills | Status: AC

## 2022-07-26 MED ORDER — OXYCODONE-ACETAMINOPHEN 5-325 MG PO TABS
1.0000 | ORAL_TABLET | Freq: Once | ORAL | Status: AC
  Administered 2022-07-26: 1 via ORAL
  Filled 2022-07-26: qty 1

## 2022-07-26 MED ORDER — IBUPROFEN 600 MG PO TABS: 600.0000 mg | ORAL_TABLET | Freq: Four times a day (QID) | ORAL | 0 refills | Status: DC | PRN

## 2022-07-26 MED ORDER — OXYCODONE-ACETAMINOPHEN 5-325 MG PO TABS: 1.0000 | ORAL_TABLET | Freq: Three times a day (TID) | ORAL | 0 refills | Status: DC | PRN

## 2022-07-26 MED ORDER — CEPHALEXIN 250 MG PO CAPS
500.0000 mg | ORAL_CAPSULE | Freq: Once | ORAL | Status: AC
  Administered 2022-07-26: 500 mg via ORAL
  Filled 2022-07-26: qty 2

## 2022-07-26 NOTE — Discharge Instructions (Addendum)
Your x-rays today do not show any broken bones in your foot.  Keep your bandages on your foot clean and dry for the next 2 days.  After that you can remove and change the bandages daily, as needed, but the bleeding should have slowed down or stopped.  You can also shower with regular soap and water.  You can use the crutches as needed for the next 2 days to help with the pain and soreness in your foot.  Try to keep your foot elevated is much as possible on the couch, and you can apply ice as needed to the top of the foot.  You can be weightbearing as tolerated on your right foot.  I recommend you try to keep your foot out of any restricting boots or shoes for the next 7 days, to allow the swelling to go down.  *  If you are still having difficulty bearing weight on your right foot after 7 days, you can call to schedule follow-up appointment with an orthopedic doctor.   *  You should return to the ER if you have worsening pain in your foot, especially if there is redness spreading up your foot, which is a sign of infection, or discoloration of your toes, such as your toes turning purple or black, which could be a sign of blood flow problems with your foot.

## 2022-07-26 NOTE — ED Notes (Signed)
He ambulated to the bathroom with crutches with no difficulty
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
## 2022-12-30 ENCOUNTER — Emergency Department (HOSPITAL_BASED_OUTPATIENT_CLINIC_OR_DEPARTMENT_OTHER)
Admission: EM | Admit: 2022-12-30 | Discharge: 2022-12-31 | Disposition: A | Discharge status: Home / Self Care | Payer: Self-pay | Attending: Emergency Medicine | Admitting: Emergency Medicine

## 2022-12-30 ENCOUNTER — Other Ambulatory Visit: Payer: Self-pay

## 2022-12-30 ENCOUNTER — Encounter (HOSPITAL_BASED_OUTPATIENT_CLINIC_OR_DEPARTMENT_OTHER): Payer: Self-pay

## 2022-12-30 DIAGNOSIS — R300 Dysuria: Secondary | ICD-10-CM | POA: Insufficient documentation

## 2022-12-30 DIAGNOSIS — R1032 Left lower quadrant pain: Secondary | ICD-10-CM | POA: Insufficient documentation

## 2022-12-30 DIAGNOSIS — R112 Nausea with vomiting, unspecified: Secondary | ICD-10-CM | POA: Insufficient documentation

## 2022-12-30 DIAGNOSIS — D72829 Elevated white blood cell count, unspecified: Secondary | ICD-10-CM | POA: Insufficient documentation

## 2022-12-30 LAB — CBC
HCT: 48.8 % (ref 39.0–52.0)
Hemoglobin: 17.5 g/dL — ABNORMAL HIGH (ref 13.0–17.0)
MCH: 34.4 pg — ABNORMAL HIGH (ref 26.0–34.0)
MCHC: 35.9 g/dL (ref 30.0–36.0)
MCV: 95.9 fL (ref 80.0–100.0)
Platelets: 286 10*3/uL (ref 150–400)
RBC: 5.09 MIL/uL (ref 4.22–5.81)
RDW: 12.3 % (ref 11.5–15.5)
WBC: 15.6 10*3/uL — ABNORMAL HIGH (ref 4.0–10.5)
nRBC: 0 % (ref 0.0–0.2)

## 2022-12-30 LAB — COMPREHENSIVE METABOLIC PANEL
ALT: 87 U/L — ABNORMAL HIGH (ref 0–44)
AST: 52 U/L — ABNORMAL HIGH (ref 15–41)
Albumin: 4.1 g/dL (ref 3.5–5.0)
Alkaline Phosphatase: 87 U/L (ref 38–126)
Anion gap: 11 (ref 5–15)
BUN: 7 mg/dL (ref 6–20)
CO2: 24 mmol/L (ref 22–32)
Calcium: 8.8 mg/dL — ABNORMAL LOW (ref 8.9–10.3)
Chloride: 101 mmol/L (ref 98–111)
Creatinine, Ser: 0.79 mg/dL (ref 0.61–1.24)
GFR, Estimated: >60 mL/min (ref >60)
Glucose, Bld: 109 mg/dL — ABNORMAL HIGH (ref 70–99)
Potassium: 3.9 mmol/L (ref 3.5–5.1)
Sodium: 136 mmol/L (ref 135–145)
Total Bilirubin: 1.4 mg/dL — ABNORMAL HIGH (ref 0.3–1.2)
Total Protein: 7.6 g/dL (ref 6.5–8.1)

## 2022-12-30 LAB — URINALYSIS, ROUTINE W REFLEX MICROSCOPIC
Bilirubin Urine: NEGATIVE
Glucose, UA: NEGATIVE mg/dL
Hgb urine dipstick: NEGATIVE
Ketones, ur: NEGATIVE mg/dL
Leukocytes,Ua: NEGATIVE
Nitrite: NEGATIVE
Protein, ur: NEGATIVE mg/dL
Specific Gravity, Urine: <=1.005 (ref 1.005–1.030)
pH: 5.5 (ref 5.0–8.0)

## 2022-12-30 LAB — LIPASE, BLOOD: Lipase: 37 U/L (ref 11–51)

## 2022-12-30 NOTE — ED Triage Notes (Addendum)
Pt c/o left sided abdominal pain that goes across abdomen and to rt. Flank Started 2 days ago +N/V +dysuria

## 2022-12-31 ENCOUNTER — Emergency Department (HOSPITAL_BASED_OUTPATIENT_CLINIC_OR_DEPARTMENT_OTHER): Payer: Self-pay

## 2022-12-31 MED ORDER — IOHEXOL 300 MG/ML  SOLN
125.0000 mL | Freq: Once | INTRAMUSCULAR | Status: AC | PRN
  Administered 2022-12-31: 125 mL via INTRAVENOUS

## 2022-12-31 NOTE — ED Provider Notes (Signed)
[place] EMERGENCY DEPARTMENT AT [facility]  Provider Note  CSN: [id number] Arrival date & time: 12/30/22 2222  History Chief Complaint  Patient presents with   Abdominal Pain    Tracy Schroeder is a 32 y.o. male with no significant PMH reports 2-3 days of worsening LLQ abdominal pain, occasional nausea/vomiting, but no fever. Pain has radiated some to right abdomen and left flank, but mostly is LLQ. No prior history of same. Had some dysuria earlier.    Home Medications Prior to Admission medications   Medication Sig Start Date End Date Taking? Authorizing Provider  amoxicillin-clavulanate (AUGMENTIN) 875-125 MG tablet Take 1 tablet by mouth every 12 (twelve) hours. 01/26/22   Molpus, John, MD  chlorhexidine (PERIDEX) 0.12 % solution Use as directed 15 mLs in the mouth or throat 2 (two) times daily. Swish and spit 01/22/22   Tilden Fossa, MD  HYDROcodone-acetaminophen [facility]) 5-325 MG tablet Take 1 tablet by mouth every 4 (four) hours as needed for severe pain. 01/26/22   Molpus, John, MD  hydrOXYzine (ATARAX) 25 MG tablet Take 1 tablet (25 mg total) by mouth 3 (three) times daily as needed for anxiety. 07/08/21   Ajibola, Ene A, NP  ibuprofen (ADVIL) 600 MG tablet Take 1 tablet (600 mg total) by mouth every 6 (six) hours as needed for up to 30 doses for mild pain or moderate pain. 07/26/22   Terald Sleeper, MD  loratadine (CLARITIN) 10 MG tablet Take 10 mg by mouth daily as needed for allergies.     [provider]  oxyCODONE-acetaminophen (PERCOCET/ROXICET) 5-325 MG tablet Take 1 tablet by mouth every 8 (eight) hours as needed for up to 10 doses for severe pain. 07/26/22   Terald Sleeper, MD  pantoprazole (PROTONIX) 40 MG tablet Take 40 mg by mouth daily.     [provider]  PARoxetine (PAXIL) 20 MG tablet Take 1 tablet (20 mg total) by mouth daily. 07/08/21 07/08/22  Cecilio Asper A, NP  traZODone (DESYREL) 50 MG tablet Take 1 tablet (50 mg total) by  mouth at bedtime as needed for sleep. 07/08/21   Cecilio Asper A, NP     Allergies    Shellfish allergy   Review of Systems   Review of Systems Please see HPI for pertinent positives and negatives  Physical Exam BP 137/87 (BP Location: Left Arm)   Pulse 94   Temp 97.8 F (36.6 C)   Resp 20   Ht 6\' 4"  (1.93 m)   Wt 129.3 kg   SpO2 98%   BMI 34.69 kg/m   Physical Exam Vitals and nursing note reviewed.  Constitutional:      Appearance: Normal appearance.  HENT:     Head: Normocephalic and atraumatic.     Nose: Nose normal.     Mouth/Throat:     Mouth: Mucous membranes are moist.  Eyes:     Extraocular Movements: Extraocular movements intact.     Conjunctiva/sclera: Conjunctivae normal.  Cardiovascular:     Rate and Rhythm: Normal rate.  Pulmonary:     Effort: Pulmonary effort is normal.     Breath sounds: Normal breath sounds.  Abdominal:     General: Abdomen is flat.     Palpations: Abdomen is soft.     Tenderness: There is abdominal tenderness in the left lower quadrant. There is guarding. Negative signs include Murphy's sign and McBurney's sign.  Musculoskeletal:        General: No swelling. Normal range of  motion.     Cervical back: Neck supple.  Skin:    General: Skin is warm and dry.  Neurological:     General: No focal deficit present.     Mental Status: He is alert.  Psychiatric:        Mood and Affect: Mood normal.     ED Results / Procedures / Treatments   EKG None  Procedures Procedures  Medications Ordered in the ED Medications  iohexol (OMNIPAQUE) 300 MG/ML solution 125 mL (125 mLs Intravenous Contrast Given 12/31/22 0108)    Initial Impression and Plan  Patient here with gradually worsening LLQ abdominal pain. Tenderness in that area concerning for diverticulitis. Vitals are reassuring. Labs done in triage show CBC with leukocytosis. CMP, Lipase and UA are unremarkable. Will send for CT to eval source of pain.   ED Course   Clinical  Course as of 12/31/22 0210  Tue Dec 31, 2022  0209 I personally viewed the images from radiology studies and agree with radiologist interpretation: CT is neg for acute process. Patient aware of fatty liver. He is comfortable going home. Recommend rest, OTC pain medications and PCP follow up, RTED for any other concerns.   [CS]    Clinical Course User Index [CS] Pollyann Savoy, MD     MDM Rules/Calculators/A&P Medical Decision Making Problems Addressed: Left lower quadrant abdominal pain: acute illness or injury  Amount and/or Complexity of Data Reviewed Labs: ordered. Decision-making details documented in ED Course. Radiology: ordered and independent interpretation performed. Decision-making details documented in ED Course.  Risk Prescription drug management.     Final Clinical Impression(s) / ED Diagnoses Final diagnoses:  Left lower quadrant abdominal pain    Rx / DC Orders ED Discharge Orders     None        Pollyann Savoy, MD 12/31/22 0210

## 2023-03-02 ENCOUNTER — Emergency Department (HOSPITAL_COMMUNITY)
Admission: EM | Admit: 2023-03-02 | Discharge: 2023-03-03 | Disposition: A | Discharge status: Home / Self Care | Payer: Self-pay | Attending: Student | Admitting: Student

## 2023-03-02 ENCOUNTER — Emergency Department (HOSPITAL_COMMUNITY): Payer: Self-pay

## 2023-03-02 ENCOUNTER — Other Ambulatory Visit: Payer: Self-pay

## 2023-03-02 ENCOUNTER — Encounter (HOSPITAL_COMMUNITY): Payer: Self-pay

## 2023-03-02 DIAGNOSIS — R451 Restlessness and agitation: Secondary | ICD-10-CM | POA: Insufficient documentation

## 2023-03-02 DIAGNOSIS — F69 Unspecified disorder of adult personality and behavior: Secondary | ICD-10-CM | POA: Diagnosis present

## 2023-03-02 DIAGNOSIS — Y908 Blood alcohol level of 240 mg/100 ml or more: Secondary | ICD-10-CM | POA: Insufficient documentation

## 2023-03-02 DIAGNOSIS — D72829 Elevated white blood cell count, unspecified: Secondary | ICD-10-CM | POA: Insufficient documentation

## 2023-03-02 DIAGNOSIS — R4689 Other symptoms and signs involving appearance and behavior: Secondary | ICD-10-CM | POA: Insufficient documentation

## 2023-03-02 DIAGNOSIS — J45909 Unspecified asthma, uncomplicated: Secondary | ICD-10-CM | POA: Insufficient documentation

## 2023-03-02 DIAGNOSIS — R45851 Suicidal ideations: Secondary | ICD-10-CM | POA: Insufficient documentation

## 2023-03-02 DIAGNOSIS — F17210 Nicotine dependence, cigarettes, uncomplicated: Secondary | ICD-10-CM | POA: Insufficient documentation

## 2023-03-02 DIAGNOSIS — F10129 Alcohol abuse with intoxication, unspecified: Secondary | ICD-10-CM | POA: Diagnosis present

## 2023-03-02 DIAGNOSIS — F10120 Alcohol abuse with intoxication, uncomplicated: Secondary | ICD-10-CM | POA: Insufficient documentation

## 2023-03-02 DIAGNOSIS — F410 Panic disorder [episodic paroxysmal anxiety] without agoraphobia: Secondary | ICD-10-CM | POA: Insufficient documentation

## 2023-03-02 LAB — BLOOD GAS, VENOUS
Acid-base deficit: 0.6 mmol/L (ref 0.0–2.0)
Bicarbonate: 27.8 mmol/L (ref 20.0–28.0)
Drawn by: 27160
O2 Saturation: 54.4 %
Patient temperature: 37.2
pCO2, Ven: 63 mm[Hg] — ABNORMAL HIGH (ref 44–60)
pH, Ven: 7.26 (ref 7.25–7.43)
pO2, Ven: 34 mm[Hg] (ref 32–45)

## 2023-03-02 LAB — CBC WITH DIFFERENTIAL/PLATELET
Abs Immature Granulocytes: 0.05 10*3/uL (ref 0.00–0.07)
Basophils Absolute: 0.1 10*3/uL (ref 0.0–0.1)
Basophils Relative: 1 %
Eosinophils Absolute: 0.6 10*3/uL — ABNORMAL HIGH (ref 0.0–0.5)
Eosinophils Relative: 4 %
HCT: 51.9 % (ref 39.0–52.0)
Hemoglobin: 18.2 g/dL — ABNORMAL HIGH (ref 13.0–17.0)
Immature Granulocytes: 0 %
Lymphocytes Relative: 41 %
Lymphs Abs: 5.7 10*3/uL — ABNORMAL HIGH (ref 0.7–4.0)
MCH: 34.3 pg — ABNORMAL HIGH (ref 26.0–34.0)
MCHC: 35.1 g/dL (ref 30.0–36.0)
MCV: 97.9 fL (ref 80.0–100.0)
Monocytes Absolute: 1.1 10*3/uL — ABNORMAL HIGH (ref 0.1–1.0)
Monocytes Relative: 8 %
Neutro Abs: 6.4 10*3/uL (ref 1.7–7.7)
Neutrophils Relative %: 46 %
Platelets: 291 10*3/uL (ref 150–400)
RBC: 5.3 MIL/uL (ref 4.22–5.81)
RDW: 12.6 % (ref 11.5–15.5)
WBC: 13.9 10*3/uL — ABNORMAL HIGH (ref 4.0–10.5)
nRBC: 0 % (ref 0.0–0.2)

## 2023-03-02 LAB — COMPREHENSIVE METABOLIC PANEL
ALT: 99 U/L — ABNORMAL HIGH (ref 0–44)
AST: 62 U/L — ABNORMAL HIGH (ref 15–41)
Albumin: 4.3 g/dL (ref 3.5–5.0)
Alkaline Phosphatase: 92 U/L (ref 38–126)
Anion gap: 14 (ref 5–15)
BUN: 10 mg/dL (ref 6–20)
CO2: 20 mmol/L — ABNORMAL LOW (ref 22–32)
Calcium: 8.4 mg/dL — ABNORMAL LOW (ref 8.9–10.3)
Chloride: 107 mmol/L (ref 98–111)
Creatinine, Ser: 0.83 mg/dL (ref 0.61–1.24)
GFR, Estimated: >60 mL/min (ref >60)
Glucose, Bld: 126 mg/dL — ABNORMAL HIGH (ref 70–99)
Potassium: 3.4 mmol/L — ABNORMAL LOW (ref 3.5–5.1)
Sodium: 141 mmol/L (ref 135–145)
Total Bilirubin: 1.1 mg/dL (ref <1.2)
Total Protein: 7.8 g/dL (ref 6.5–8.1)

## 2023-03-02 LAB — TROPONIN I (HIGH SENSITIVITY): Troponin I (High Sensitivity): 2 ng/L (ref <18)

## 2023-03-02 LAB — ETHANOL: Alcohol, Ethyl (B): 303 mg/dL (ref <10)

## 2023-03-02 MED ORDER — LORAZEPAM 1 MG PO TABS: 2.0000 mg | ORAL_TABLET | Freq: Once | ORAL | Status: DC | PRN

## 2023-03-02 MED ORDER — LORAZEPAM 2 MG/ML IJ SOLN
2.0000 mg | Freq: Once | INTRAMUSCULAR | Status: AC
  Administered 2023-03-02: 2 mg via INTRAMUSCULAR
  Filled 2023-03-02: qty 1

## 2023-03-02 MED ORDER — KETAMINE HCL 50 MG/ML IJ SOLN
500.0000 mg | Freq: Once | INTRAMUSCULAR | Status: AC
  Administered 2023-03-02: 500 mg via INTRAMUSCULAR
  Filled 2023-03-02: qty 10

## 2023-03-02 MED ORDER — LACTATED RINGERS IV BOLUS
1000.0000 mL | Freq: Once | INTRAVENOUS | Status: AC
  Administered 2023-03-02: 1000 mL via INTRAVENOUS

## 2023-03-02 MED ORDER — ACETAMINOPHEN 325 MG PO TABS
650.0000 mg | ORAL_TABLET | Freq: Once | ORAL | Status: AC
  Administered 2023-03-02: 650 mg via ORAL
  Filled 2023-03-02: qty 2

## 2023-03-02 MED ORDER — ZIPRASIDONE MESYLATE 20 MG IM SOLR
20.0000 mg | Freq: Once | INTRAMUSCULAR | Status: AC
  Administered 2023-03-02: 20 mg via INTRAMUSCULAR
  Filled 2023-03-02: qty 20

## 2023-03-02 NOTE — BH Assessment (Signed)
Comprehensive Clinical Assessment (CCA) Note  03/02/2023 Tracy Schroeder [id number]  Disposition: Per Roselyn Bering, NP,  inpatient psychiatric treatment is recommended. Disposition Social Worker to seek appropriate placement.   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 The history was provided by the patient's mother, who reports that the patient recently lost a friend to suicide, with the funeral taking place earlier today. The patient has been drinking heavily throughout the day and reportedly developed chest pain and palpitations at home, prompting a call to EMS.  Upon EMS arrival, the patient was verbally and physically aggressive, cursing at staff, and making threats toward medical personnel. Due to his intoxicated and agitated state, obtaining additional history was challenging. The patient did acknowledge high levels of anxiety and confirmed a history of psychiatric medication use, including hydroxyzine, Paxil, and Trazodone. However, he reports that these medications have not been effective in managing his symptoms. He stated that he often self-medicates with substances to cope with stress.  When assessed by TTS Clinician via telehealth, the patient stated, "I got into a liquor bottle and started acting crazy, I'm better now." He  acknowledged his alcoholism, stating, "When I drink alcohol, I really get into my emotions; when I'm not drinking, I'm fine." Despite the noted stressor of attending a friend's funeral earlier that day, the patient denied that it triggered today's episode. He further denied any current suicidal or homicidal ideation, as well as psychotic symptoms.   The patient has a long history of substance use, including alcohol and methamphetamine. He began drinking alcohol at age 83 and reports consuming 6-8 beers daily for the past 2 years. His last drink was late Friday night into early Saturday morning, after which he went on an alcohol binge. The patient denies any history of alcohol-related seizures, delirium tremens (DTs), or withdrawal symptoms in the past.   In addition to alcohol, the patient has a history of methamphetamine use. He began using methamphetamines at age 52 and reports smoking approximately 1 gram of methamphetamine daily. He states he has stopped using methamphetamines, although he has not engaged in detox or residential treatment for substance use.   The patient also reports occasional use of cannabis oil in the past but denies regular use. He smokes  to 1 pack of cigarettes daily. Currently, he verbalizes a lack of interest in any substance use treatment or rehabilitation programs.  The patient has a history of adjustment disorder, panic attacks, anxiety, and PTSD. He was previously prescribed hydroxyzine, Paxil, and Trazodone but reports inadequate symptom control with these medications and discontinued their use about a year ago. He does not currently have a psychiatrist or therapist and has no history of inpatient psychiatric treatment.  The patient is single, with no children, and lives with his mother. 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 There is no  family history of mental illness or substance use disorders, and the patient denies any personal history of trauma or abuse.  The patient presented intoxicated, agitated, and emotionally distressed, likely due to a combination of alcohol intoxication and underlying anxiety. On initial assessment, he was oriented to person, place, and time but exhibited aggressive and threatening behavior, likely related to his intoxication and emotional dysregulation. During the telehealth assessment, he appeared calm and cooperative, suggesting his agitation was more pronounced during the acute intoxicated state. His history of alcohol and methamphetamine use, combined with self-medication for anxiety and PTSD, raises concerns about his emotional stability and coping mechanisms. Recent binge drinking, coupled with the stress of attending a friend's funeral, seems to have triggered heightened instability. Given the complexity of his presentation and aggressive behaviors, inpatient psychiatric treatment is strongly recommended for stabilization and further evaluation.   CCA Screening, Triage and Referral (STR)  Patient Reported Information How did you hear about [place]? Patient presented with EMS. What Is the Reason for Your Visit/Call Today? Tracy Schroeder is a 32 year old male with a past medical history of adjustment disorder, panic attacks, anxiety, PTSD, COPD, and alcohol use disorder, who presents to the Emergency Department for evaluation of alcohol intoxication and a panic attack. The history was provided by the patient's mother, who reports that the patient recently lost a friend to suicide, with the funeral taking place earlier today. The patient has been drinking heavily throughout the day and reportedly developed chest pain and palpitations at home, prompting a call to EMS.    Upon EMS arrival, the patient was verbally and physically aggressive, cursing at staff, and making threats toward medical personnel. Due to his  intoxicated and agitated state, obtaining additional history was challenging. The patient did acknowledge high levels of anxiety and confirmed a history of psychiatric medication use, including hydroxyzine, Paxil, and Trazodone. However, he reports that these medications have not been effective in managing his symptoms. He stated that he often self-medicates with substances to cope with stress.  How Long Has This Been Causing You Problems? > than 6 months  What Do You Feel Would Help You the Most Today? Alcohol or Drug Use Treatment; Treatment for Depression or other mood problem   Have You Recently Had Any Thoughts About Hurting Yourself? No  Are You Planning to Commit Suicide/Harm Yourself At This time? No   Flowsheet Row ED from 12/30/2022 in [facility] Emergency Department at [facility] ED from 07/26/2022 in [facility] Emergency Department at [facility] ED from 04/12/2022 in [facility] Emergency Department at [facility]  C-SSRS RISK CATEGORY No Risk No Risk No Risk       Have you Recently Had Thoughts About Hurting Someone Karolee Ohs? No  Are You Planning to Harm Someone at This Time? No  Explanation: Patient denies.   Have You Used Any Alcohol or Drugs in the Past 24 Hours? Yes  What Did You Use and How Much? Based on the clinical information provided, the patient has used the following substances in the past 24 hours:  Alcohol: The patient reported drinking heavily throughout the day and having his last drink late Friday night into early Saturday morning. He describes consuming 6-8 beers daily for the past two years. He indicated that he went on an alcohol binge recently.  Methamphetamines: The patient reports using 1 gram of methamphetamine daily. However, he states that he has stopped using methamphetamines  recently, but the frequency and amount of use in the past 24 hours were not explicitly detailed. He did admit to having increased use of methamphetamines  recently.  Cigarettes: The patient smokes half to one pack of cigarettes daily.  Cannabis oil: The patient mentioned occasional use of cannabis oil, but did not specify the frequency or quantity.   Do You Currently Have a Therapist/Psychiatrist? No  Name of Therapist/Psychiatrist: Name of Therapist/Psychiatrist: No therapist or psychiatrist.   Have You Been Recently Discharged From Any Office Practice or Programs? No  Explanation of Discharge From Practice/Program: n/a     CCA Screening Triage Referral Assessment Type of Contact: Tele-Assessment  Telemedicine Service Delivery: Telemedicine service delivery: This service was provided via telemedicine using a 2-way, interactive audio and video technology  Is this Initial or Reassessment? Is this Initial or Reassessment?: Initial Assessment  Date Telepsych consult ordered in [facility]:  Date Telepsych consult ordered in [facility]: 03/02/23  Time Telepsych consult ordered in [facility]:    Location of Assessment: [facility]  Provider Location: [facility] Assessment Services   Collateral Involvement: Collateral information was provided by the patient's mother, who was present during the initial evaluation. She reported several key details regarding the patient's recent emotional and substance use history:  Recent Stressors: The patient recently lost a close friend to suicide, and the funeral occurred earlier today. The patient's mother noted that this was a significant emotional trigger, although the patient himself did not explicitly acknowledge it as a trigger for today's behavior.  Substance Use: The patient's mother confirmed that he has been drinking heavily throughout the day and acknowledged that the patient has a long-standing history of alcohol use, which has been problematic in the past. She also reported that the patient's substance use, particularly methamphetamines, has escalated recently.  Behavior: The patient's mother reported that the patient has been  emotionally distressed and that he sometimes becomes verbally and physically aggressive, as evidenced by his interaction with EMS staff and the medical team upon arrival. She expressed concern about the patient's ongoing alcohol and drug use.  Family History and Support: The patient's mother is his primary caregiver and support system. The patient's mother and sister appear to be the primary family members involved in his life.   Does Patient Have a Automotive engineer Guardian? No  Legal Guardian Contact Information: no legal guardian.  Copy of Legal Guardianship Form: No - copy requested  Legal Guardian Notified of Arrival: Successfully notified  Legal Guardian Notified of Pending Discharge: Successfully notified  If Minor and Not Living with Parent(s), Who has Custody? n/a  Is CPS involved or ever been involved? Never  Is APS involved or ever been involved? Never   Patient Determined To Be At Risk for Harm To Self or Others Based on Review of Patient Reported Information or Presenting Complaint? Yes, for Self-Harm  Method: No Plan  Availability of Means: No access or NA  Intent: Vague intent or NA  Notification Required: No need or identified person  Additional Information for Danger to Others Potential: -- (none reported)  Additional Comments for Danger to Others Potential: Verbal Aggression and Threats: The patient was verbally threatening to staff and EMS personnel, which suggests heightened emotional instability, possibly triggered by a combination of alcohol intoxication, anxiety, and recent emotional stressors (e.g., the recent loss of a friend to suicide and the funeral that occurred earlier today).  Physical Aggression: The patient was also described as being physically aggressive with EMS staff, though  there is no report of actual violence. This aggression may be related to both his substance use (alcohol and possibly methamphetamine) and underlying anxiety or PTSD.  Given the patient's high levels of distress and substance intoxication, his behavior could escalate further if not appropriately managed.  Alcohol and Drug Use: The patient has a history of substance use, including alcohol and methamphetamines, both of which are known to contribute to increased aggression, impulsivity, and disinhibition, especially in the context of withdrawal or intoxication. Methamphetamine use in particular is associated with violent behavior in some individuals.  Psychiatric History: The patient's history of anxiety, panic attacks, and PTSD also contributes to his overall emotional dysregulation. While he denies current homicidal ideation, the high level of agitation and emotional instability following recent trauma. Denial of Current Suicidal or Homicidal Ideation: While the patient denies any current suicidal or homicidal ideation, his aggressive outbursts, combined with his recent emotional distress, suggest that close monitoring is necessary. His poor coping mechanisms (substance use as self-medication) and lack of psychiatric support increase his vulnerability to violent behavior, particularly in stressful situations.  Are There Guns or Other Weapons in Your Home? No  Types of Guns/Weapons: Denies access to weapons .  Are These Weapons Safely Secured?                            No  Who Could Verify You Are Able To Have These Secured: Patient denies access to weapons.  Do You Have any Outstanding Charges, Pending Court Dates, Parole/Probation? Patient denies.  Contacted To Inform of Risk of Harm To Self or Others: Family/Significant Other: (Patient denies that he is a risk to others at the time of the assessment. However, his mother is aware of his behaviors per ED notes.)    Does Patient Present under Involuntary Commitment? Yes (Patient IVC'd by the EDP during his admission due to aggressive behaviors.)    [place] of Residence: [facility]  Patient Currently  Receiving the Following Services: None reported. Patient is not receiving any services at this time.   Determination of Need: Urgent (48 hours)   Options For Referral: Facility-Based Crisis; Medication Management; Chemical Dependency Intensive Outpatient Therapy (CDIOP)     CCA Biopsychosocial Patient Reported Schizophrenia/Schizoaffective Diagnosis in Past: No   Strengths: n/a   Mental Health Symptoms Depression:   Difficulty Concentrating; Change in energy/activity; Fatigue; Hopelessness; Increase/decrease in appetite; Irritability; Tearfulness; Weight gain/loss; Worthlessness   Duration of Depressive symptoms:  Duration of Depressive Symptoms: Greater than two weeks   Mania:   Irritability; Increased Energy; Change in energy/activity   Anxiety:    Irritability; Difficulty concentrating   Psychosis:   None   Duration of Psychotic symptoms:    Trauma:   None   Obsessions:   None   Compulsions:   Not connected to stressor   Inattention:   None   Hyperactivity/Impulsivity:   None   Oppositional/Defiant Behaviors:   None   Emotional Irregularity:   None   Other Mood/Personality Symptoms:   Patient is currently calm and cooperative.    Mental Status Exam Appearance and self-care  Stature:   Average   Weight:   Average weight   Clothing:   Neat/clean   Grooming:   Normal   Cosmetic use:   Age appropriate   Posture/gait:   Normal   Motor activity:   Not Remarkable   Sensorium  Attention:   Normal   Concentration:   Normal  Orientation:   Time; Situation; Place; Person; Object   Recall/memory:   Normal   Affect and Mood  Affect:   Depressed; Flat   Mood:   Anxious   Relating  Eye contact:   Normal   Facial expression:   Anxious   Attitude toward examiner:   Cooperative   Thought and Language  Speech flow:  Normal   Thought content:   Appropriate to Mood and Circumstances   Preoccupation:   None    Hallucinations:   None   Organization:   Coherent   [employer] of Knowledge:   Average   Intelligence:   Average   Abstraction:   Normal   Judgement:   Poor; Dangerous   Reality Testing:   Adequate   Insight:   Poor   Decision Making:   Impulsive   Social Functioning  Social Maturity:   Impulsive   Social Judgement:   Heedless   Stress  Stressors:   Grief/losses   Coping Ability:   Normal   Skill Deficits:   Activities of daily living   Supports:   Friends/Service system     Religion: Religion/Spirituality Are You A Religious Person?: No How Might This Affect Treatment?: n/a  Leisure/Recreation: Leisure / Recreation Do You Have Hobbies?: No  Exercise/Diet: Exercise/Diet Do You Exercise?: No Have You Gained or Lost A Significant Amount of Weight in the Past Six Months?: No Do You Follow a Special Diet?: No Do You Have Any Trouble Sleeping?: No (5-7 hrs per night)   CCA Employment/Education Employment/Work Situation: Employment / Work Situation Employment Situation: Employed Work Stressors: None reported Patient's Job has Been Impacted by Current Illness: No Has Patient ever Been in Equities trader?: No  Education: Education Is Patient Currently Attending School?: No Last Grade Completed:  (12th grade) Did You Product manager?: No Did You Have An Individualized Education Program (IIEP): No Did You Have Any Difficulty At School?: No Patient's Education Has Been Impacted by Current Illness: No   CCA Family/Childhood History Family and Relationship History: Family history Marital status: Single Does patient have children?: No  Childhood History:  Childhood History By whom was/is the patient raised?: Both parents Did patient suffer any verbal/emotional/physical/sexual abuse as a child?: No Did patient suffer from severe childhood neglect?: No Has patient ever been sexually abused/assaulted/raped as an adolescent or  adult?: No Was the patient ever a victim of a crime or a disaster?: No Witnessed domestic violence?: No Has patient been affected by domestic violence as an adult?: No       CCA Substance Use Alcohol/Drug Use: Alcohol / Drug Use Pain Medications: See MAR Over the Counter: See MAR History of alcohol / drug use?: Yes Longest period of sobriety (when/how long): One year Negative Consequences of Use: Personal relationships Withdrawal Symptoms: None Substance #1 Name of Substance 1: Alcohol 1 - Age of First Use: 20 1 - Amount (size/oz): 6-8 beers 1 - Frequency: daily 1 - Duration: 2 years 1 - Last Use / Amount: Late Friday night into early Saturday morning (binge drinking).  Alcohol binge, estimated consumption of 6-8 beers 1 - Method of Aquiring: Purchased at stores or through social circles. 1- Route of Use: Oral (Drinking) Substance #2 Name of Substance 2: Methamphetamine 2 - Age of First Use: 32 years old 2 - Amount (size/oz): Approximately 1 gram per day 2 - Frequency: Daily (but patient has stopped use 1 year ago) 2 - Duration: Approximately 1 year of regular use. 2 -  Last Use / Amount: Patient states that he has not used in one year 2 - Method of Aquiring: Obtained through social circles or street sources (not specified) 2 - Route of Substance Use: Smoked Substance #3 Name of Substance 3: Cannabis oil 3 - Age of First Use: Not specified (occasional use) 3 - Amount (size/oz): Occasional use (not regular) 3 - Frequency: Infrequent, as mentioned, once or occasionally 3 - Duration: Not clearly stated 3 - Last Use / Amount: Not specified 3 - Method of Aquiring: Unkown 3 - Route of Substance Use: Oral or vaporized (method not clearly specified) Substance #4 Name of Substance 4: Nicotine/Cigarettes 4 - Age of First Use: Not specified 4 - Amount (size/oz): 1/2 to 1 pack per day 4 - Frequency: daily 4 - Duration: Long term use (exact duration not specified) 4 - Last Use /  Amount: on-going 4 - Method of Aquiring: 1/2 to 1 pack 4 - Route of Substance Use: inhaled-smoking                 ASAM's:  Six Dimensions of Multidimensional Assessment  Dimension 1:  Acute Intoxication and/or Withdrawal Potential:      Dimension 2:  Biomedical Conditions and Complications:      Dimension 3:  Emotional, Behavioral, or Cognitive Conditions and Complications:     Dimension 4:  Readiness to Change:     Dimension 5:  Relapse, Continued use, or Continued Problem Potential:     Dimension 6:  Recovery/Living Environment:     ASAM Severity Score:    ASAM Recommended Level of Treatment:     Substance use Disorder (SUD) Substance Use Disorder (SUD)  Checklist Symptoms of Substance Use: Continued use despite having a persistent/recurrent physical/psychological problem caused/exacerbated by use, Large amounts of time spent to obtain, use or recover from the substance(s), Evidence of tolerance, Continued use despite persistent or recurrent social, interpersonal problems, caused or exacerbated by use, Evidence of withdrawal (Comment), Persistent desire or unsuccessful efforts to cut down or control use, Recurrent use that results in a failure to fulfill major role obligations (work, school, home), Presence of craving or strong urge to use, Repeated use in physically hazardous situations, Substance(s) often taken in larger amounts or over longer times than was intended, Social, occupational, recreational activities given up or reduced due to use  Recommendations for Services/Supports/Treatments: Recommendations for Services/Supports/Treatments Recommendations For Services/Supports/Treatments: Medication Management, Inpatient Hospitalization, CD-IOP Intensive Chemical Dependency Program, Residential-Level 1  Discharge Disposition:    DSM5 Diagnoses: Patient Active Problem List   Diagnosis Date Noted   Seasonal allergic rhinitis 06/25/2022   Gastroesophageal reflux disease  05/23/2022   Anxiety, generalized 04/24/2017   Cigarette nicotine dependence without complication 04/22/2017   Adjustment disorder with emotional disturbance 04/15/2017   Dyslipidemia (high LDL; low HDL) 09/04/2015   Elevated LFTs 06/05/2015   Leukocytosis 06/05/2015   Allergic reaction to food 05/25/2015     Referrals to Alternative Service(s): Referred to Alternative Service(s):   Place:   Date:   Time:    Referred to Alternative Service(s):   Place:   Date:   Time:    Referred to Alternative Service(s):   Place:   Date:   Time:    Referred to Alternative Service(s):   Place:   Date:   Time:     Melynda Ripple, Counselor

## 2023-03-02 NOTE — BH Assessment (Signed)
This Clinical research associate contacted patient's nurse in an attempt to assess patient although patient was medicated earlier due to agitation. Patient is still sleeping per nurse and staff there state that TTS will be notified once patient is awake. Patient had a noted BAL of 303 on arrival.

## 2023-03-02 NOTE — ED Provider Notes (Signed)
  Physical Exam  BP 130/76   Pulse 95   Temp 99 F (37.2 C) (Axillary)   Resp (!) 23   Wt 129.3 kg Comment: Simultaneous filing. User may not have seen previous data.  SpO2 98%   BMI 34.70 kg/m   Physical Exam  Procedures  Procedures  ED Course / MDM    Medical Decision Making Amount and/or Complexity of Data Reviewed Labs: ordered. Radiology: ordered. ECG/medicine tests: ordered.  Risk Prescription drug management.   Received in signout.  Had been agitated.  Reportedly very drunk after friend's funeral.  Required sedation with ketamine Geodon and Ativan.  Found to be hypoxic.  X-ray reassuring.  Thought to be secondary to the sedation.  Discussed with patient's mother.  Had been IVC.  Will continue to monitor if mental status improves and psychiatric standpoint improved should be able to go home.  Mental status improved some.  Titrating down oxygen.  Does not remember much of last night but states he feels like shit.  States his best friend a 15-year shot recently killed himself over a girl and the funeral was yesterday.  Complaining now of left shoulder pain.  X-ray done reassuring.  Still pending TTS evaluation.  Still somewhat evasive about his feelings since yesterday.       Benjiman Core, MD 03/02/23 [phone]

## 2023-03-02 NOTE — ED Notes (Signed)
Pt ambulated to restroom. Pt shut door. Staff heard a bang & opened up door to find pt on the floor. Staff pulled pt out of the bathroom & lifted him up onto stretcher. Pt woken up fighting staff. Pt punching and kicking. Pt given IM meds & shackled to bed by PD

## 2023-03-02 NOTE — ED Notes (Signed)
Pt wanted his mother to be called and updated, nurse called and left VM for mother to call back for an update.

## 2023-03-02 NOTE — ED Notes (Signed)
Pt is awake at this time, NR removed, pt placed on 2L of O2. Pt is calm and cooperative. Pt is apologizing for his actions. Pt has given verbal consent for staff to speak to his mother.

## 2023-03-02 NOTE — ED Notes (Signed)
Re-faxed Affidavit sheet to Magistrate with completed section
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
## 2023-03-02 NOTE — ED Notes (Signed)
Pt still sighting against staff. Pt given IM ketamine & soon after transported to CT for scan

## 2023-03-02 NOTE — ED Triage Notes (Signed)
BIBA per ems pt called out for an anxiety attack. ETOH on board. Pt aggressive upon arrival to ED
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
## 2023-03-02 NOTE — ED Notes (Signed)
Pt belongings placed in locker number 1 

## 2023-03-02 NOTE — ED Notes (Signed)
Pollyann Glen called for TTS with pt., but said he will call back in a few hours considering the pt. Is still sleeping.

## 2023-03-02 NOTE — BH Assessment (Addendum)
As per the shift report, TTS staff attempted to assess the patient earlier today; however, the patient was unable to be seen due to sedation. TTS staff is currently awaiting follow-up from [facility] nursing staff to determine when they can be seen. Per current nursing staff patient is now ready to be seen.
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
## 2023-03-03 ENCOUNTER — Encounter (HOSPITAL_COMMUNITY): Payer: Self-pay | Admitting: Registered Nurse

## 2023-03-03 DIAGNOSIS — F10129 Alcohol abuse with intoxication, unspecified: Secondary | ICD-10-CM

## 2023-03-03 DIAGNOSIS — F69 Unspecified disorder of adult personality and behavior: Secondary | ICD-10-CM | POA: Diagnosis present

## 2023-03-03 MED ORDER — NICOTINE 21 MG/24HR TD PT24
21.0000 mg | MEDICATED_PATCH | Freq: Once | TRANSDERMAL | Status: DC
  Administered 2023-03-03: 21 mg via TRANSDERMAL
  Filled 2023-03-03: qty 1

## 2023-03-03 MED ORDER — ACETAMINOPHEN 325 MG PO TABS
650.0000 mg | ORAL_TABLET | ORAL | Status: DC | PRN
  Administered 2023-03-03: 650 mg via ORAL
  Filled 2023-03-03: qty 2

## 2023-03-03 NOTE — ED Notes (Signed)
Per [facility], pt is psyched cleared. EDP made aware. Waiting for d/c home at this time.

## 2023-03-03 NOTE — ED Notes (Signed)
Pt back from court yard and back in room

## 2023-03-03 NOTE — ED Notes (Signed)
IVC paper work resended and faxed to [employer]

## 2023-03-03 NOTE — BH Assessment (Addendum)
Disposition Note: On 03/02/2023, per Roselyn Bering, NP, inpatient psychiatric treatment was recommended for the patient. Following this, [facility] AC, Edythe Clarity, RN, assessed the patient for admission to [facility] and indicated, "We will offer this patient a bed tomorrow, pending discharges; day shift AC will provide an update."  The clinician followed up with today's [facility], Veda Canning, RN, who confirmed that [facility] is still waiting on discharges and that no beds are currently available. Meanwhile, the patient has been referred to the following hospitals for consideration of bed placement.  Destination  Service Provider Request Status Selected Services Address Phone Fax Patient Preferred  [facility]  Pending - Request Sent -- [address], [place] [address] [phone] --  [facility] Patient Placement  Pending - Request Sent -- [facility], [place] [phone] --  [facility]  Pending - Request Rhina Brackett [place] [address] [phone] --  [facility]  Pending - Request Sent -- [address], [place] [address] [phone] --  [facility]  Pending - Request Sent -- [address] [place] [address] [phone] --  [facility]  Pending - Request Sent -- [address] [place] [address] [phone] --  [facility]  Pending - Request Sent -- [address] [place], [place] [address] [phone] --  [facility]  Pending - Request Sent -- [address] [place], [place] [address] [phone] --  [facility]  Pending - Request Sent -- [address], [place] [address] [phone] --  [facility]  Pending - Request Sent -- [phone] [place] [address] [phone] --  [facility]  Pending - Request Sent -- [address] [place] [address] [phone] --  [facility]  Pending - Request Sent -- [address], [place] [address] [phone] --  [facility]  Pending - Request Sent -- [address], [place] [address] [phone] --  [facility]  Pending - Request Sent -- [address] [place] [address] [phone] --  [facility]  Pending - Request Sent -- [address] [place] [address] [phone] --  [facility]  Pending - Request Sent -- [address] Kar[place]e [place]s [place] [address] [phone] --  [facility]  Pending - Request Sent -- [address] Karolee Ohs [place] [phone] --  [facility]  Pending - Request Sent -- [address] [place] [address] [phone] --  [facility]  Pending - Request Sent -- [address], [place] [address] [phone] --  [facility]  Pending - Request Sent -- [address] [place] [address] [phone] --  [facility]  Pending - Request Sent -- [address] [place], [place] [address] [phone] --  [facility]  Pending - Request Sent -- [address] [place] [address] [phone] --  [facility]  Pending - No Request Sent -- [place] [phone] -- --

## 2023-03-03 NOTE — ED Provider Notes (Addendum)
Emergency Medicine Observation Re-evaluation Note  Tracy Schroeder is a 32 y.o. male, seen on rounds today.  Pt initially presented to the ED for complaints of Alcohol Intoxication and Suicidal Currently, the patient is sleeping.  Physical Exam  BP 124/71   Pulse 67   Temp 98.4 F (36.9 C) (Oral)   Resp 19   Wt 129.3 kg Comment: Simultaneous filing. User may not have seen previous data.  SpO2 90%   BMI 34.70 kg/m  Physical Exam General: Sleeping Cardiac: Extremities well-perfused Lungs: Breathing is unlabored Psych: Deferred  ED Course / MDM  EKG:   I have reviewed the labs performed to date as well as medications administered while in observation.  Recent changes in the last 24 hours include evaluation by TTS who recommends inpatient psychiatric treatment.  Plan  Current plan is for inpatient psychiatric admission.  After psychiatric reevaluation, patient is cleared from psychiatric standpoint.  On assessment, patient feels well.  IVC was rescinded.  He was discharged in stable condition.    Gloris Manchester, MD 03/03/23 1478    Gloris Manchester, MD 03/03/23 1345    Gloris Manchester, MD 03/03/23 1346

## 2023-03-03 NOTE — ED Notes (Signed)
Pts belongings returned and ride called

## 2023-03-03 NOTE — BH Assessment (Signed)
Per Assunta Found, NP, patient is psych cleared for discharge.

## 2023-03-03 NOTE — Consult Note (Signed)
Telepsych Consultation   Reason for Consult:  Intoxication, aggressive behavior, IVC Referring Physician:   Glendora Score, MD  Location of Patient: [facility] ED Location of Provider: Other: [facility]  Patient Identification: Tracy Schroeder MRN:  [id number] Principal Diagnosis: Alcohol abuse with intoxication (HCC) Diagnosis:  Principal Problem:   Alcohol abuse with intoxication (HCC) Active Problems:   Behavior concern in adult   Total Time spent with patient: 30 minutes  Chart Review:  "Tracy Schroeder is a 32 year old male with a past medical history of adjustment disorder, panic attacks, anxiety, PTSD, COPD, and alcohol use disorder, who presents to the Emergency Department for evaluation of alcohol intoxication and a panic attack. The history was provided by the patient's mother, who reports that the patient recently lost a friend to suicide, with the funeral taking place earlier today. The patient has been drinking heavily throughout the day and reportedly developed chest pain and palpitations at home, prompting a call to EMS."  HPI:  Assessment:  Tracy Schroeder reassessed virtually via tele psych by this provider, chart reviewed, and consulted with Dr. Nelly Rout on 03/03/23 On evaluation, Tracy Schroeder is sitting up in bed with no noted distress.  He is alert, oriented x 4, calm, and cooperative.  His speech is clear, coherent, at normal rate, and moderate volume.  His/Her mood is dysphoric with congruent affect.  Patient states that when he came to the hospital he was intoxicated and agitated.  He states "I believe it was because I was drunk.  I'm fine and ready to go home.  Patient denies suicidal/self-harm/homicidal ideation, psychosis, and paranoia.  Patient also denies prior psychiatric history (No suicide attempt, self-harm, or inpt/outpt services.   Objectively there is no evidence of psychosis/mania or delusional thinking.  His responses to assessment questions were relevant and  appropriate.  He conversed coherently, with goal directed thoughts, no distractibility, or pre-occupation.  Patient gave permission to speak to his mother or father for collateral information.    Collateral Information:  Spoke to patients father Tracy Schroeder [phone].  Tracy Schroeder states that he has no problem with patient coming home.  He states "I think he was just drunk.  Normal he isn't like that but when he starts drinking he doesn't know how to quit.  States that he feels that patient is safe to come home.  Reports that patients mother would have to pick up because he can't drive.  States he would like patient to be provided with outpatient psychiatric resources.    He is instructed to call 911 and present to the nearest emergency room should he experience any suicidal/homicidal ideation, auditory/visual/hallucinations, or detrimental worsening of his mental health condition.  Patient also instructed that he could call the toll-free phone number on back of insurance card/Medicaid card to get assist with identifying counselors and agencies in network or those with Medicaid can speak with a Medicaid care coordinator.    Past Psychiatric History: alcohol abuse  Risk to Self:  Denies Risk to Others:  Denies Prior Inpatient Therapy:  Denies Prior Outpatient Therapy:  When younger  Past Medical History:  Past Medical History:  Diagnosis Date   Adjustment disorder with emotional disturbance 04/15/2017   Allergic reaction to food 05/25/2015   Anxiety    Anxiety, generalized 04/24/2017   Asthma    Cigarette nicotine dependence without complication 04/22/2017   Dyslipidemia (high LDL; low HDL) 09/04/2015   Elevated LFTs 06/05/2015   Gastroesophageal reflux disease 05/23/2022   GERD (gastroesophageal reflux disease)  Leukocytosis 06/05/2015   Panic attacks    Seasonal allergic rhinitis 06/25/2022   History reviewed. No pertinent surgical history. Family History:  Family History  Problem  Relation Age of Onset   Hyperlipidemia Mother    Thyroid disease Mother    Asthma Mother    Hyperlipidemia Father    Hypertension Father    COPD Father    Family Psychiatric  History: None reported Social History:  Social History   Substance and Sexual Activity  Alcohol Use Yes   Comment: 6 pack daily     Social History   Substance and Sexual Activity  Drug Use No    Social History   Socioeconomic History   Marital status: Single    Spouse name: Not on file   Number of children: Not on file   Years of education: Not on file   Highest education level: Not on file  Occupational History   Not on file  Tobacco Use   Smoking status: Every Day    Current packs/day: 0.50    Types: Cigarettes   Smokeless tobacco: Current  Vaping Use   Vaping status: Never Used  Substance and Sexual Activity   Alcohol use: Yes    Comment: 6 pack daily   Drug use: No   Sexual activity: Not on file  Other Topics Concern   Not on file  Social History Narrative   Not on file   Social Determinants of Health   Financial Resource Strain: Low Risk  (05/23/2022)   Received from [facility], [facility]   Overall Financial Resource Strain (CARDIA)    Difficulty of Paying Living Expenses: Not hard at all  Food Insecurity: No Food Insecurity (05/23/2022)   Received from [facility], [facility]   Hunger Vital Sign    Worried About Running Out of Food in the Last Year: Never true    Ran Out of Food in the Last Year: Never true  Transportation Needs: No Transportation Needs (05/23/2022)   Received from [facility], [facility]   PRAPARE - Transportation    Lack of Transportation (Medical): No    Lack of Transportation (Non-Medical): No  Physical Activity: Inactive (05/30/2022)   Received from [facility], [facility]   Exercise Vital Sign    Days of Exercise per Week: 0 days    Minutes of Exercise per Session: 0 min  Stress: No Stress Concern Present (05/30/2022)   Received from  [facility] - Occupational Stress Questionnaire    Feeling of Stress : Only a little  Social Connections: Unknown (05/20/2022)   Received from [facility], [facility]   Social Network    Social Network: Not on file   Additional Social History:    Allergies:   Allergies  Allergen Reactions   Shellfish Allergy Anaphylaxis    Labs:  Results for orders placed or performed during the hospital encounter of 03/02/23 (from the past 48 hour(s))  Comprehensive metabolic panel     Status: Abnormal   Collection Time: 03/02/23  3:19 AM  Result Value Ref Range   Sodium 141 135 - 145 mmol/L   Potassium 3.4 (L) 3.5 - 5.1 mmol/L   Chloride 107 98 - 111 mmol/L   CO2 20 (L) 22 - 32 mmol/L   Glucose, Bld 126 (H) 70 - 99 mg/dL    Comment: Glucose reference range applies only to samples taken after fasting for at least 8 hours.  BUN 10 6 - 20 mg/dL   Creatinine, Ser 8.46 0.61 - 1.24 mg/dL   Calcium 8.4 (L) 8.9 - 10.3 mg/dL   Total Protein 7.8 6.5 - 8.1 g/dL   Albumin 4.3 3.5 - 5.0 g/dL   AST 62 (H) 15 - 41 U/L   ALT 99 (H) 0 - 44 U/L   Alkaline Phosphatase 92 38 - 126 U/L   Total Bilirubin 1.1 <1.2 mg/dL   GFR, Estimated >96 >29 mL/min    Comment: (NOTE) Calculated using the CKD-EPI Creatinine Equation (2021)    Anion gap 14 5 - 15    Comment: Performed at [facility], [address], [place], [place] [address]  Troponin I (High Sensitivity)     Status: None   Collection Time: 03/02/23  3:19 AM  Result Value Ref Range   Troponin I (High Sensitivity) 2 <18 ng/L    Comment: (NOTE) Elevated high sensitivity troponin I (hsTnI) values and significant  changes across serial measurements may suggest ACS but many other  chronic and acute conditions are known to elevate hsTnI results.  Refer to the "Links" section for chest pain algorithms and additional  guidance. Performed at [facility], [address] [place], [place]  [address]   CBC with Differential     Status: Abnormal   Collection Time: 03/02/23  3:19 AM  Result Value Ref Range   WBC 13.9 (H) 4.0 - 10.5 K/uL   RBC 5.30 4.22 - 5.81 MIL/uL   Hemoglobin 18.2 (H) 13.0 - 17.0 g/dL   HCT 10.2 72.5 - 36.6 %   MCV 97.9 80.0 - 100.0 fL   MCH 34.3 (H) 26.0 - 34.0 pg   MCHC 35.1 30.0 - 36.0 g/dL   RDW 44.0 34.7 - 42.5 %   Platelets 291 150 - 400 K/uL   nRBC 0.0 0.0 - 0.2 %   Neutrophils Relative % 46 %   Neutro Abs 6.4 1.7 - 7.7 K/uL   Lymphocytes Relative 41 %   Lymphs Abs 5.7 (H) 0.7 - 4.0 K/uL   Monocytes Relative 8 %   Monocytes Absolute 1.1 (H) 0.1 - 1.0 K/uL   Eosinophils Relative 4 %   Eosinophils Absolute 0.6 (H) 0.0 - 0.5 K/uL   Basophils Relative 1 %   Basophils Absolute 0.1 0.0 - 0.1 K/uL   Immature Granulocytes 0 %   Abs Immature Granulocytes 0.05 0.00 - 0.07 K/uL    Comment: Performed at [facility], [address], [place], [place] [address]  Ethanol     Status: Abnormal   Collection Time: 03/02/23  3:19 AM  Result Value Ref Range   Alcohol, Ethyl (B) 303 (HH) <10 mg/dL    Comment: CRITICAL RESULT CALLED TO, READ BACK BY AND VERIFIED WITH WOODY,A @ 0347 ON 03/02/23 BY JUW (NOTE) Lowest detectable limit for serum alcohol is 10 mg/dL.  For medical purposes only. Performed at [facility], [address] [place], [place] [address]   Blood gas, venous (at [facility] and [facility])     Status: Abnormal   Collection Time: 03/02/23  7:14 AM  Result Value Ref Range   pH, Ven 7.26 7.25 - 7.43   pCO2, Ven 63 (H) 44 - 60 mmHg   pO2, Ven 34 32 - 45 mmHg   Bicarbonate 27.8 20.0 - 28.0 mmol/L   Acid-base deficit 0.6 0.0 - 2.0 mmol/L   O2 Saturation 54.4 %   Patient temperature 37.2    Collection site RIGHT ANTECUBITAL  Drawn by [id number]     Comment: Performed at [facility], [address], [place], [place] [address]    Medications:  Current Facility-Administered Medications  Medication Dose Route Frequency Provider Last Rate Last Admin    acetaminophen (TYLENOL) tablet 650 mg  650 mg Oral Q4H PRN Gloris Manchester, MD       LORazepam (ATIVAN) tablet 2 mg  2 mg Oral Once PRN Kommor, Madison, MD       nicotine (NICODERM CQ - dosed in mg/24 hours) patch 21 mg  21 mg Transdermal Once Gloris Manchester, MD   21 mg at 03/03/23 1017   Current Outpatient Medications  Medication Sig Dispense Refill   loratadine (CLARITIN) 10 MG tablet Take 10 mg by mouth daily as needed for allergies.      pantoprazole (PROTONIX) 40 MG tablet Take 1 tablet by mouth daily.      Musculoskeletal: Strength & Muscle Tone: within normal limits Gait & Station: normal Patient leans: N/A          Psychiatric Specialty Exam:  Presentation  General Appearance:  Appropriate for Environment  Eye Contact: Good  Speech: Clear and Coherent; Normal Rate  Speech Volume: Normal  Handedness: Right   Mood and Affect  Mood: Dysphoric  Affect: Congruent   Thought Process  Thought Processes: Coherent; Goal Directed  Descriptions of Associations:Intact  Orientation:Full (Time, Place and Person)  Thought Content:Logical  History of Schizophrenia/Schizoaffective disorder:No  Duration of Psychotic Symptoms:No data recorded Hallucinations:Hallucinations: None  Ideas of Reference:None  Suicidal Thoughts:Suicidal Thoughts: No  Homicidal Thoughts:Homicidal Thoughts: No   Sensorium  Memory: Immediate Good; Recent Good  Judgment: Intact  Insight: Present   Executive Functions  Concentration: Good  Attention Span: Good  Recall: Good  Fund of Knowledge: Good  Language: Good   Psychomotor Activity  Psychomotor Activity: Psychomotor Activity: Normal   Assets  Assets: Communication Skills; Desire for Improvement; Financial Resources/Insurance; Housing; Social Support   Sleep  Sleep: Sleep: Good    Physical Exam: Physical Exam Vitals and nursing note reviewed.  Constitutional:      General: He is not in acute  distress.    Appearance: Normal appearance. He is not ill-appearing.  HENT:     Head: Normocephalic.  Cardiovascular:     Rate and Rhythm: Normal rate.  Pulmonary:     Effort: Pulmonary effort is normal. No respiratory distress.  Neurological:     Mental Status: He is alert and oriented to person, place, and time.    Review of Systems  Constitutional:        No other complaints voiced  Psychiatric/Behavioral:  Depression: Stable. Hallucinations: Denies. Substance abuse: Alcohol. Suicidal ideas: Denies. Nervous/anxious: Stable.   All other systems reviewed and are negative.  Blood pressure (!) 134/95, pulse 88, temperature 98.5 F (36.9 C), temperature source Oral, resp. rate 14, weight 129.3 kg, SpO2 98%. Body mass index is 34.7 kg/m.  Treatment Plan Summary: Plan Psychiatrically cleared.  Resources for outpatient psychiatric and substance use services  Disposition: No evidence of imminent risk to self or others at present.   Patient does not meet criteria for psychiatric inpatient admission. Supportive therapy provided about ongoing stressors. Discussed crisis plan, support from social network, calling 911, coming to the Emergency Department, and calling Suicide Hotline.  This service was provided via telemedicine using a 2-way, interactive audio and video technology.  Names of all persons participating in this telemedicine service and their role in this encounter. Name: Assunta Found Role: NP  Name: Tracy Schroeder Role: Patient  Name:  Role:   Name:  Role:     Assunta Found, NP 03/03/2023 12:15 PM

## 2023-03-03 NOTE — ED Notes (Signed)
Pt escorted to outside courtyard with security and sitter. Pt has been calm this shift so far

## 2023-07-06 ENCOUNTER — Encounter (HOSPITAL_BASED_OUTPATIENT_CLINIC_OR_DEPARTMENT_OTHER): Payer: Self-pay | Admitting: Emergency Medicine

## 2023-07-06 ENCOUNTER — Other Ambulatory Visit: Payer: Self-pay

## 2023-07-06 ENCOUNTER — Emergency Department (HOSPITAL_BASED_OUTPATIENT_CLINIC_OR_DEPARTMENT_OTHER)
Admission: EM | Admit: 2023-07-06 | Discharge: 2023-07-06 | Disposition: A | Discharge status: Home / Self Care | Payer: Self-pay | Attending: Emergency Medicine | Admitting: Emergency Medicine

## 2023-07-06 DIAGNOSIS — S90861A Insect bite (nonvenomous), right foot, initial encounter: Secondary | ICD-10-CM | POA: Insufficient documentation

## 2023-07-06 DIAGNOSIS — W57XXXA Bitten or stung by nonvenomous insect and other nonvenomous arthropods, initial encounter: Secondary | ICD-10-CM | POA: Insufficient documentation

## 2023-07-06 MED ORDER — DOXYCYCLINE HYCLATE 100 MG PO TABS
100.0000 mg | ORAL_TABLET | Freq: Once | ORAL | Status: AC
  Administered 2023-07-06: 100 mg via ORAL
  Filled 2023-07-06: qty 1

## 2023-07-06 MED ORDER — DOXYCYCLINE HYCLATE 100 MG PO CAPS: 100.0000 mg | ORAL_CAPSULE | Freq: Two times a day (BID) | ORAL | 0 refills | Status: DC

## 2023-07-06 MED ORDER — IBUPROFEN 800 MG PO TABS
800.0000 mg | ORAL_TABLET | Freq: Once | ORAL | Status: AC
  Administered 2023-07-06: 800 mg via ORAL
  Filled 2023-07-06: qty 1

## 2023-07-06 NOTE — Discharge Instructions (Addendum)
 Evaluation today revealed you have cellulitis which is infection of the skin between your toes. I am starting on doxycycline.  Please take the entire course even if your symptoms are improving.  Recommend you follow-up with PCP.  If the swelling, redness and pain starts to radiate up your foot or up your leg, you develop fever and chills or any other concerning symptom please return to the emergency department for further evaluation.  Recommend Tylenol ibuprofen for pain.

## 2023-07-06 NOTE — ED Triage Notes (Signed)
 Pt presents with possible spider bite between 4th and 5th toes on right foot, first noticed on Friday. Pain currently rated 6/10 with no meds PTA.

## 2023-07-06 NOTE — ED Provider Notes (Signed)
  [place] [facility] Provider Note   CSN: [id number] Arrival date & time: 07/06/23  2122     History  Chief Complaint  Patient presents with   Insect Bite   HPI Tracy Schroeder is a 33 y.o. male presenting for insect bite.  States he believes is a spider bite. States he went into the "chicken coop" and felt a bite on the distal end of his foot near his fourth and fifth toe.  He noticed the bite this past Friday.  States the swelling has been somewhat worse and is more painful.  Stated there was a site where it started to ooze clear discharge.  He denies fever and chills.  Denies nausea or vomiting.  HPI     Home Medications Prior to Admission medications   Medication Sig Start Date End Date Taking? Authorizing Provider  loratadine (CLARITIN) 10 MG tablet Take 10 mg by mouth daily as needed for allergies.     [provider]  pantoprazole (PROTONIX) 40 MG tablet Take 1 tablet by mouth daily. 05/23/22   [provider]      Allergies    Shellfish allergy    Review of Systems   See HPI  Physical Exam Updated Vital Signs BP 129/83 (BP Location: Right Arm)   Pulse 93   Temp 98.6 F (37 C)   Resp (!) 21   Ht 6\' 4"  (1.93 m)   Wt 129.3 kg   SpO2 99%   BMI 34.69 kg/m  Physical Exam Constitutional:      Appearance: Normal appearance.  HENT:     Head: Normocephalic.     Nose: Nose normal.  Eyes:     Conjunctiva/sclera: Conjunctivae normal.  Pulmonary:     Effort: Pulmonary effort is normal.  Musculoskeletal:       Feet:  Neurological:     Mental Status: He is alert.  Psychiatric:        Mood and Affect: Mood normal.     ED Results / Procedures / Treatments   Labs (all labs ordered are listed, but only abnormal results are displayed) Labs Reviewed - No data to display  EKG None  Radiology No results found.  Procedures Procedures    Medications Ordered in ED Medications  ibuprofen (ADVIL) tablet  800 mg (has no administration in time range)  doxycycline (VIBRA-TABS) tablet 100 mg (has no administration in time range)    ED Course/ Medical Decision Making/ A&P                                 Medical Decision Making  33 year old well-appearing male presenting for insect bite. Exam findings concerning for localized cellulitis with possibly a tiny abscess at the base of the fourth and fifth toe of the left foot.  Likely was an insect bite of some kind.  At this time feel antibiotic management is indicated.  Started him on doxycycline.  Advised him to follow-up with his PCP.  Discussed strict return precautions.  Discharged in good condition.        Final Clinical Impression(s) / ED Diagnoses Final diagnoses:  Insect bite of right foot, initial encounter    Rx / DC Orders ED Discharge Orders     None         Gareth Eagle, PA-C 07/06/23 2313    Alvira Monday, MD 07/07/23 2257

## 2023-10-24 ENCOUNTER — Encounter (HOSPITAL_BASED_OUTPATIENT_CLINIC_OR_DEPARTMENT_OTHER): Payer: Self-pay

## 2023-10-24 ENCOUNTER — Other Ambulatory Visit: Payer: Self-pay

## 2023-10-24 DIAGNOSIS — K0889 Other specified disorders of teeth and supporting structures: Secondary | ICD-10-CM | POA: Insufficient documentation

## 2023-10-24 DIAGNOSIS — Z87891 Personal history of nicotine dependence: Secondary | ICD-10-CM | POA: Insufficient documentation

## 2023-10-24 DIAGNOSIS — J45909 Unspecified asthma, uncomplicated: Secondary | ICD-10-CM | POA: Insufficient documentation

## 2023-10-24 NOTE — ED Triage Notes (Signed)
 Pt states he has abscess on left side at his wisdom tooth that has been bothering him x4 days. Denies any other s/s at time of triage.

## 2023-10-25 ENCOUNTER — Emergency Department (HOSPITAL_BASED_OUTPATIENT_CLINIC_OR_DEPARTMENT_OTHER)
Admission: EM | Admit: 2023-10-25 | Discharge: 2023-10-25 | Disposition: A | Discharge status: Home / Self Care | Payer: Self-pay | Attending: Emergency Medicine | Admitting: Emergency Medicine

## 2023-10-25 DIAGNOSIS — K0889 Other specified disorders of teeth and supporting structures: Secondary | ICD-10-CM

## 2023-10-25 MED ORDER — AMOXICILLIN-POT CLAVULANATE 875-125 MG PO TABS
1.0000 | ORAL_TABLET | Freq: Once | ORAL | Status: AC
  Administered 2023-10-25: 1 via ORAL
  Filled 2023-10-25: qty 1

## 2023-10-25 MED ORDER — OXYCODONE-ACETAMINOPHEN 5-325 MG PO TABS
1.0000 | ORAL_TABLET | Freq: Once | ORAL | Status: AC
  Administered 2023-10-25: 1 via ORAL
  Filled 2023-10-25: qty 1

## 2023-10-25 MED ORDER — AMOXICILLIN-POT CLAVULANATE 875-125 MG PO TABS: 1.0000 | ORAL_TABLET | Freq: Two times a day (BID) | ORAL | 0 refills | Status: DC

## 2023-10-25 MED ORDER — OXYCODONE-ACETAMINOPHEN 5-325 MG PO TABS: 1.0000 | ORAL_TABLET | Freq: Four times a day (QID) | ORAL | 0 refills | Status: AC | PRN

## 2023-10-25 MED ORDER — SENNOSIDES-DOCUSATE SODIUM 8.6-50 MG PO TABS
1.0000 | ORAL_TABLET | Freq: Every evening | ORAL | 0 refills | Status: AC | PRN
Start: 2023-10-25 — End: ?

## 2023-10-25 NOTE — Discharge Instructions (Addendum)
 Please take your antibiotics as prescribed.  Percocet will cause constipation and can impair your ability to drive.  It can make you drowsy.  Please take only as needed for severe pain.  I would advise taking ibuprofen  at home as well unless you been told not to take this medicine.  Please call the dentist listed on this form for close follow-up.

## 2023-10-25 NOTE — ED Provider Notes (Signed)
 Emergency Department Provider Note   I have reviewed the triage vital signs and the nursing notes.   HISTORY  Chief Complaint Dental Pain   HPI Tracy Schroeder is a 33 y.o. male presents to the emergency department for evaluation of left-sided dental pain.  Patient has had several days of pain with subjective fevers.  He admits to diffusely poor dentition.  No difficulty swallowing or breathing.  He does not currently follow with a dentist. No dental injury.    Past Medical History:  Diagnosis Date   Adjustment disorder with emotional disturbance 04/15/2017   Allergic reaction to food 05/25/2015   Anxiety    Anxiety, generalized 04/24/2017   Asthma    Cigarette nicotine  dependence without complication 04/22/2017   Dyslipidemia (high LDL; low HDL) 09/04/2015   Elevated LFTs 06/05/2015   Gastroesophageal reflux disease 05/23/2022   GERD (gastroesophageal reflux disease)    Leukocytosis 06/05/2015   Panic attacks    Seasonal allergic rhinitis 06/25/2022    Review of Systems  Constitutional: No fever/chills ENT: No sore throat. Positive dental pain  Cardiovascular: Denies chest pain. Respiratory: Denies shortness of breath. Gastrointestinal: No abdominal pain.  Skin: Negative for rash. Neurological: Negative for headaches. ____________________________________________   PHYSICAL EXAM:  VITAL SIGNS: ED Triage Vitals  Encounter Vitals Group     BP 10/24/23 2359 129/76     Pulse Rate 10/24/23 2359 (!) 109     Resp 10/24/23 2359 16     Temp 10/24/23 2359 98.2 F (36.8 C)     Temp Source 10/24/23 2359 Oral     SpO2 10/24/23 2359 95 %     Weight 10/24/23 2357 285 lb (129.3 kg)     Height 10/24/23 2357 6' 4 (1.93 m)   Constitutional: Alert and oriented. Well appearing and in no acute distress. Eyes: Conjunctivae are normal Head: Atraumatic. Nose: No congestion/rhinnorhea. Mouth/Throat: Mucous membranes are moist.  Oropharynx non-erythematous.  Diffusely poor  dentition but no visible dental or gingival abscess.  No trismus.  Soft submandibular compartment. Neck: No stridor.   Cardiovascular: Good peripheral circulation.  Respiratory: Normal respiratory effort.  Gastrointestinal: No distention.  Musculoskeletal: No gross deformities of extremities. Neurologic:  Normal speech and language.   ____________________________________________   PROCEDURES  Procedure(s) performed:   Procedures  None  ____________________________________________   INITIAL IMPRESSION / ASSESSMENT AND PLAN / ED COURSE  Pertinent labs & imaging results that were available during my care of the patient were reviewed by me and considered in my medical decision making (see chart for details).   This patient is Presenting for Evaluation of dental pain, which does require a range of treatment options, and is a complaint that involves a moderate risk of morbidity and mortality.  The Differential Diagnoses include dental caries, gingivitis, dental abscess, Ludwig's angina, etc.  Critical Interventions-    Medications  amoxicillin -clavulanate (AUGMENTIN ) 875-125 MG per tablet 1 tablet (1 tablet Oral Given 10/25/23 0144)  oxyCODONE -acetaminophen  (PERCOCET/ROXICET) 5-325 MG per tablet 1 tablet (1 tablet Oral Given 10/25/23 0144)    Reassessment after intervention: pain improved.   Medical Decision Making: Summary:  Patient presents to the emergency department with dental pain and reported fever.  No visible abscess.  Plan to start on Augmentin  and sent home with a short course of pain management medication.  Gave contact information for on-call dentistry to call for follow up.   Patient's presentation is most consistent with acute, uncomplicated illness.   Disposition: discharge  ____________________________________________  FINAL CLINICAL  IMPRESSION(S) / ED DIAGNOSES  Final diagnoses:  Pain, dental     NEW OUTPATIENT MEDICATIONS STARTED DURING THIS  VISIT:  Discharge Medication List as of 10/25/2023  1:47 AM     START taking these medications   Details  amoxicillin -clavulanate (AUGMENTIN ) 875-125 MG tablet Take 1 tablet by mouth every 12 (twelve) hours., Starting Sat 10/25/2023, Normal    oxyCODONE -acetaminophen  (PERCOCET/ROXICET) 5-325 MG tablet Take 1 tablet by mouth every 6 (six) hours as needed for severe pain (pain score 7-10)., Starting Sat 10/25/2023, Normal    senna-docusate (SENOKOT-S) 8.6-50 MG tablet Take 1 tablet by mouth at bedtime as needed for mild constipation., Starting Sat 10/25/2023, Normal        Note:  This document was prepared using Dragon voice recognition software and may include unintentional dictation errors.  Fonda Law, MD, [facility] Emergency Medicine    Ahmya Bernick, Fonda MATSU, MD 10/25/23 [phone]

## 2023-11-25 ENCOUNTER — Emergency Department (HOSPITAL_COMMUNITY)
Admission: EM | Admit: 2023-11-25 | Discharge: 2023-11-25 | Disposition: A | Discharge status: Home / Self Care | Payer: Self-pay | Attending: Emergency Medicine | Admitting: Emergency Medicine

## 2023-11-25 ENCOUNTER — Other Ambulatory Visit: Payer: Self-pay

## 2023-11-25 ENCOUNTER — Encounter (HOSPITAL_COMMUNITY): Payer: Self-pay | Admitting: Emergency Medicine

## 2023-11-25 DIAGNOSIS — F419 Anxiety disorder, unspecified: Secondary | ICD-10-CM | POA: Insufficient documentation

## 2023-11-25 DIAGNOSIS — R45851 Suicidal ideations: Secondary | ICD-10-CM | POA: Insufficient documentation

## 2023-11-25 LAB — COMPREHENSIVE METABOLIC PANEL WITH GFR
ALT: 89 U/L — ABNORMAL HIGH (ref 0–44)
AST: 56 U/L — ABNORMAL HIGH (ref 15–41)
Albumin: 4.1 g/dL (ref 3.5–5.0)
Alkaline Phosphatase: 83 U/L (ref 38–126)
Anion gap: 12 (ref 5–15)
BUN: 6 mg/dL (ref 6–20)
CO2: 23 mmol/L (ref 22–32)
Calcium: 9 mg/dL (ref 8.9–10.3)
Chloride: 102 mmol/L (ref 98–111)
Creatinine, Ser: 0.78 mg/dL (ref 0.61–1.24)
GFR, Estimated: >60 mL/min (ref >60)
Glucose, Bld: 104 mg/dL — ABNORMAL HIGH (ref 70–99)
Potassium: 3.8 mmol/L (ref 3.5–5.1)
Sodium: 137 mmol/L (ref 135–145)
Total Bilirubin: 1.2 mg/dL (ref 0.0–1.2)
Total Protein: 7.2 g/dL (ref 6.5–8.1)

## 2023-11-25 LAB — CBC
HCT: 52.1 % — ABNORMAL HIGH (ref 39.0–52.0)
Hemoglobin: 18.8 g/dL — ABNORMAL HIGH (ref 13.0–17.0)
MCH: 35.7 pg — ABNORMAL HIGH (ref 26.0–34.0)
MCHC: 36.1 g/dL — ABNORMAL HIGH (ref 30.0–36.0)
MCV: 98.9 fL (ref 80.0–100.0)
Platelets: 255 K/uL (ref 150–400)
RBC: 5.27 MIL/uL (ref 4.22–5.81)
RDW: 12.6 % (ref 11.5–15.5)
WBC: 12.5 K/uL — ABNORMAL HIGH (ref 4.0–10.5)
nRBC: 0 % (ref 0.0–0.2)

## 2023-11-25 LAB — RAPID URINE DRUG SCREEN, HOSP PERFORMED
Amphetamines: NOT DETECTED
Barbiturates: NOT DETECTED
Benzodiazepines: NOT DETECTED
Cocaine: NOT DETECTED
Opiates: NOT DETECTED
Tetrahydrocannabinol: NOT DETECTED

## 2023-11-25 LAB — ETHANOL: Alcohol, Ethyl (B): 190 mg/dL — ABNORMAL HIGH (ref <15)

## 2023-11-25 NOTE — ED Provider Notes (Signed)
 [place] [facility]ERG[facility] DEPARTMENT AT [facility] Provider Note   CSN: [id number] Arrival date & time: 11/25/23  0119     Patient presents with: V70.1   Tracy Schroeder is a 33 y.o. male.   Patient presents with concerns over depression.  He reports a history of depression and anxiety as well as alcohol abuse.  He reports that he has been experiencing increased symptoms, his previously prescribed medications were not helping so he stopped.  He reports that family members brought something up tonight that got him specifically upset.  He has been having some passive suicidal thoughts but does not have a plan and states that he does not want to harm himself.  He thinks he needs some new medications.       Prior to Admission medications   Medication Sig Start Date End Date Taking? Authorizing Provider  amoxicillin -clavulanate (AUGMENTIN ) 875-125 MG tablet Take 1 tablet by mouth every 12 (twelve) hours. 10/25/23   Long, Fonda MATSU, MD  doxycycline  (VIBRAMYCIN ) 100 MG capsule Take 1 capsule (100 mg total) by mouth 2 (two) times daily. 07/06/23   Robinson, John K, PA-C  loratadine  (CLARITIN ) 10 MG tablet Take 10 mg by mouth daily as needed for allergies.     [provider]  oxyCODONE -acetaminophen  (PERCOCET/ROXICET) 5-325 MG tablet Take 1 tablet by mouth every 6 (six) hours as needed for severe pain (pain score 7-10). 10/25/23   Long, Fonda MATSU, MD  pantoprazole  (PROTONIX ) 40 MG tablet Take 1 tablet by mouth daily. 05/23/22   [provider]  senna-docusate (SENOKOT-S) 8.6-50 MG tablet Take 1 tablet by mouth at bedtime as needed for mild constipation. 10/25/23   Long, Fonda MATSU, MD    Allergies: Shellfish allergy    Review of Systems  Updated Vital Signs BP 128/81   Pulse 77   Temp 98.2 F (36.8 C)   Resp 17   Ht 6' 4 (1.93 m)   Wt 129.3 kg   SpO2 98%   BMI 34.69 kg/m   Physical Exam Vitals and nursing note reviewed.  Constitutional:      General: He is not in  acute distress.    Appearance: He is well-developed.  HENT:     Head: Normocephalic and atraumatic.     Mouth/Throat:     Mouth: Mucous membranes are moist.  Eyes:     General: Vision grossly intact. Gaze aligned appropriately.     Extraocular Movements: Extraocular movements intact.     Conjunctiva/sclera: Conjunctivae normal.  Cardiovascular:     Rate and Rhythm: Normal rate and regular rhythm.     Pulses: Normal pulses.     Heart sounds: Normal heart sounds, S1 normal and S2 normal. No murmur heard.    No friction rub. No gallop.  Pulmonary:     Effort: Pulmonary effort is normal. No respiratory distress.     Breath sounds: Normal breath sounds.  Abdominal:     Palpations: Abdomen is soft.     Tenderness: There is no abdominal tenderness. There is no guarding or rebound.     Hernia: No hernia is present.  Musculoskeletal:        General: No swelling.     Cervical back: Full passive range of motion without pain, normal range of motion and neck supple. No pain with movement, spinous process tenderness or muscular tenderness. Normal range of motion.     Right lower leg: No edema.     Left lower leg: No edema.  Skin:  General: Skin is warm and dry.     Capillary Refill: Capillary refill takes less than 2 seconds.     Findings: No ecchymosis, erythema, lesion or wound.  Neurological:     Mental Status: He is alert and oriented to person, place, and time.     GCS: GCS eye subscore is 4. GCS verbal subscore is 5. GCS motor subscore is 6.     Cranial Nerves: Cranial nerves 2-12 are intact.     Sensory: Sensation is intact.     Motor: Motor function is intact. No weakness or abnormal muscle tone.     Coordination: Coordination is intact.  Psychiatric:        Mood and Affect: Mood normal.        Speech: Speech normal.        Behavior: Behavior normal.     (all labs ordered are listed, but only abnormal results are displayed) Labs Reviewed  CBC  COMPREHENSIVE METABOLIC PANEL  WITH GFR  ETHANOL  RAPID URINE DRUG SCREEN, HOSP PERFORMED    EKG: None  Radiology: No results found.   Procedures   Medications Ordered in the ED - No data to display                                  Medical Decision Making Amount and/or Complexity of Data Reviewed Labs: ordered.   Presents with depression, anxiety.  Patient had some thoughts about suicide earlier today but currently is not suicidal.  He specifically states that he does not want to harm himself.  He is concerned that he may need new medications, as his previously prescribed medications did not seem to help him.  He will benefit from behavioral health evaluation tonight.  He is medically clear.     Final diagnoses:  Suicidal ideation    ED Discharge Orders     None          Niomi Valent, Lonni PARAS, MD 11/25/23 [phone]

## 2023-11-25 NOTE — ED Notes (Signed)
 Pt is denying SI or HI

## 2023-11-25 NOTE — BH Assessment (Signed)
 Comprehensive Clinical Assessment (CCA) Note  11/25/2023 Tracy Schroeder [id number]  Chief Complaint:  Chief Complaint  Patient presents with   V70.1   Medication Management   Anxiety  Disposition: Per Tracy Schroeder patient is recommended for discharge with outpatient follow-up.   The patient demonstrates the following risk factors for suicide: Chronic risk factors for suicide include: psychiatric disorder of , ADHD, anxiety and depression and substance use disorder. Acute risk factors for suicide include: family or marital conflict. Protective factors for this patient include: hope for the future. Considering these factors, the overall suicide risk at this point appears to be low. Patient is appropriate for outpatient follow up.   Patient is a 33 year old male with a history of ADHD, anxiety, depression, and alcohol abuse who presents voluntarily to [facility] seeking medication management for his anxiety. Patient resides in the home with his parents and identifies family as his primary support system.Patient reports irritability,  fatigue, and difficulty sleeping. Patient has a hx of Alcohol Abuse.Last use was today (three 12oz beers), he reports usually drinking about six, 12oz beers daily.Patient denies NSSIB, SI, HI, AVH.  Patient identifies his primary stressors as ongoing grief from her nephew's suicide attempt 2 years ago, increased anxiety, distant relationship with his children, working long hours, and alcohol abuse. Patient reports tonight he was hanging out with his cousin and they were drinking. He states his cousin started to talk about suicide and this triggered him due to his nephew shooting himself in the head 2 years ago. He states this triggered an anxiety attack and he wanted to come to the hospital to get started on a new anxiety medication. Patient states he was previously prescribed Hydroxyzine  but states it no longer works for him. He also states he would like resources for  outpatient psychiatry and therapy services. Patient states he was feeling anxious more than anything tonight. Patient denies history of abuse or trauma. Patient denies current legal problems. Patient is not receiving outpatient therapy or psychiatry services. Patient denies previous inpatient admissions for mental health. Patient denies access to weapons.   Patient is able to to contract for safety outside of the hospital. Treatment options were discussed and patient is in agreement with recommendation for outpatient follow up.       Visit Diagnosis:  Anxiety, generalized Alcohol abuse   CCA Screening, Triage and Referral (STR)  Patient Reported Information How did you hear about us ? Self  What Is the Reason for Your Visit/Call Today? Patient is a 33 year old male with a history of  ADHD, anxiety, depression, and alcohol abuse  who presents voluntarily to [facility] seeking medication management for his anxiety. Patient resides in the home with his parents and identifies family as his primary support system.Patient reports irritability,  fatigue, and difficulty sleeping. Patient has a hx of Alcohol Abuse.Last use was today (three 12oz beers), he reports usually drinking about six, 12oz beers daily.Patient denies NSSIB, SI, HI, AVH.  How Long Has This Been Causing You Problems? <Week  What Do You Feel Would Help You the Most Today? Treatment for Depression or other mood problem; Alcohol or Drug Use Treatment   Have You Recently Had Any Thoughts About Hurting Yourself? No  Are You Planning to Commit Suicide/Harm Yourself At This time? No   Flowsheet Row ED from 11/25/2023 in [facility] Emergency Department at [facility] ED from 10/25/2023 in [facility] Emergency Department at [facility] ED from 07/06/2023 in [facility] Emergency  Department at [facility]  C-SSRS RISK CATEGORY No Risk No Risk No Risk    Have you Recently Had Thoughts About Hurting Someone [name]?  No  Are You Planning to Harm Someone at This Time? No  Explanation: n/a   Have You Used Any Alcohol or Drugs in the Past 24 Hours? Yes  How Long Ago Did You Use Drugs or Alcohol? 3 hours ago What Did You Use and How Much? alcohol, three 12oz beers   Do You Currently Have a Therapist/Psychiatrist? No  Name of Therapist/Psychiatrist:    Have You Been Recently Discharged From Any Office Practice or Programs? No  Explanation of Discharge From Practice/Program: n/a     CCA Screening Triage Referral Assessment Type of Contact: Tele-Assessment  Telemedicine Service Delivery: Telemedicine service delivery: This service was provided via telemedicine using a 2-way, interactive audio and video technology  Is this Initial or Reassessment? Is this Initial or Reassessment?: Initial Assessment  Date Telepsych consult ordered in [facility]:  Date Telepsych consult ordered in [facility]: 11/25/23  Time Telepsych consult ordered in [facility]:  Time Telepsych consult ordered in [facility]: 0221  Location of Assessment: [facility]  Provider Location: [facility] Assessment Services   Collateral Involvement: n/a   Does Patient Have a Automotive engineer Guardian? No  Legal Guardian Contact Information: no legal guardian.  Copy of Legal Guardianship Form: -- (n/a)  Legal Guardian Notified of Arrival: -- (n/a)  Legal Guardian Notified of Pending Discharge: -- (n/a)  If Minor and Not Living with Parent(s), Who has Custody? n/a  Is CPS involved or ever been involved? Never  Is APS involved or ever been involved? Never   Patient Determined To Be At Risk for Harm To Self or Others Based on Review of Patient Reported Information or Presenting Complaint? No  Method: No Plan  Availability of Means: No access or NA  Intent: Vague intent or NA  Notification Required: No need or identified person  Additional Information for Danger to Others Potential: -- (n/a)  Additional Comments for Danger to Others Potential:  n/a  Are There Guns or Other Weapons in Your Home? No  Types of Guns/Weapons: Denies access to weapons .  Are These Weapons Safely Secured?                            No  Who Could Verify You Are Able To Have These Secured: Patient denies access to weapons.  Do You Have any Outstanding Charges, Pending Court Dates, Parole/Probation? Pt denies  Contacted To Inform of Risk of Harm To Self or Others: Other: Comment (n/a)    Does Patient Present under Involuntary Commitment? No    [place] of Residence: [place]   Patient Currently Receiving the Following Services: Not Receiving Services   Determination of Need: Urgent (48 hours)   Options For Referral: Medication Management; Outpatient Therapy     CCA Biopsychosocial Patient Reported Schizophrenia/Schizoaffective Diagnosis in Past: No   Strengths: Cooperation, family support   Mental Health Symptoms Depression:  Fatigue; Irritability; Sleep (too much or little)   Duration of Depressive symptoms: Duration of Depressive Symptoms: Greater than two weeks   Mania:  Irritability   Anxiety:   Irritability; Difficulty concentrating; Tension; Worrying   Psychosis:  None   Duration of Psychotic symptoms:    Trauma:  None   Obsessions:  None   Compulsions:  N/A   Inattention:  None   Hyperactivity/Impulsivity:  None  Oppositional/Defiant Behaviors:  None   Emotional Irregularity:  None   Other Mood/Personality Symptoms:  Patient is currently calm and cooperative.    Mental Status Exam Appearance and self-care  Stature:  Average   Weight:  Overweight   Clothing:  Casual   Grooming:  Normal   Cosmetic use:  None   Posture/gait:  Normal   Motor activity:  Not Remarkable   Sensorium  Attention:  Normal   Concentration:  Normal   Orientation:  X5   Recall/memory:  Normal   Affect and Mood  Affect:  Appropriate   Mood:  Anxious; Depressed   Relating  Eye contact:  Normal   Facial  expression:  Anxious   Attitude toward examiner:  Cooperative   Thought and Language  Speech flow: Normal   Thought content:  Appropriate to Mood and Circumstances   Preoccupation:  None   Hallucinations:  None   Organization:  Coherent   [employer] of Knowledge:  Average   Intelligence:  Average   Abstraction:  Normal   Judgement:  Fair   Dance movement psychotherapist:  Adequate   Insight:  Fair   Decision Making:  Impulsive   Social Functioning  Social Maturity:  Impulsive   Social Judgement:  Heedless   Stress  Stressors:  Grief/losses   Coping Ability:  Normal   Skill Deficits:  Scientist, physiological; Self-control   Supports:  Friends/Service system     Religion: Religion/Spirituality Are You A Religious Person?: No How Might This Affect Treatment?: n/a  Leisure/Recreation: Leisure / Recreation Do You Have Hobbies?: No  Exercise/Diet: Exercise/Diet Do You Exercise?: No Have You Gained or Lost A Significant Amount of Weight in the Past Six Months?: No Do You Follow a Special Diet?: No Do You Have Any Trouble Sleeping?: Yes Explanation of Sleeping Difficulties: 4 hours per night   CCA Employment/Education Employment/Work Situation: Employment / Work Situation Employment Situation: Employed Work Stressors: working long hours Patient's Job has Been Impacted by Current Illness: No Has Patient ever Been in Equities trader?: No  Education: Education Is Patient Currently Attending School?: No Last Grade Completed: 11 Did You Product manager?: No Did You Have An Individualized Education Program (IIEP): No Did You Have Any Difficulty At [employer]?: No Patient's Education Has Been Impacted by Current Illness: No   CCA Family/Childhood History Family and Relationship History: Family history Marital status: Single Does patient have children?: Yes How many children?: 2 How is patient's relationship with their children?: distant relationship (ages 16  and 8)  Childhood History:  Childhood History By whom was/is the patient raised?: Both parents Did patient suffer any verbal/emotional/physical/sexual abuse as a child?: No Did patient suffer from severe childhood neglect?: No Has patient ever been sexually abused/assaulted/raped as an adolescent or adult?: No Was the patient ever a victim of a crime or a disaster?: No Witnessed domestic violence?: No Has patient been affected by domestic violence as an adult?: No       CCA Substance Use Alcohol/Drug Use: Alcohol / Drug Use Pain Medications: See MAR Prescriptions: See MAR Over the Counter: See MAR History of alcohol / drug use?: Yes Longest period of sobriety (when/how long): One year Negative Consequences of Use: Personal relationships Withdrawal Symptoms: None                         ASAM's:  Six Dimensions of Multidimensional Assessment  Dimension 1:  Acute Intoxication and/or Withdrawal Potential:  Dimension 2:  Biomedical Conditions and Complications:      Dimension 3:  Emotional, Behavioral, or Cognitive Conditions and Complications:     Dimension 4:  Readiness to Change:     Dimension 5:  Relapse, Continued use, or Continued Problem Potential:     Dimension 6:  Recovery/Living Environment:     ASAM Severity Score:    ASAM Recommended Level of Treatment:     Substance use Disorder (SUD) Substance Use Disorder (SUD)  Checklist Symptoms of Substance Use: Continued use despite having a persistent/recurrent physical/psychological problem caused/exacerbated by use, Large amounts of time spent to obtain, use or recover from the substance(s), Evidence of tolerance, Continued use despite persistent or recurrent social, interpersonal problems, caused or exacerbated by use, Evidence of withdrawal (Comment), Persistent desire or unsuccessful efforts to cut down or control use, Recurrent use that results in a failure to fulfill major role obligations (work, school,  home), Presence of craving or strong urge to use, Repeated use in physically hazardous situations, Substance(s) often taken in larger amounts or over longer times than was intended, Social, occupational, recreational activities given up or reduced due to use  Recommendations for Services/Supports/Treatments: Recommendations for Services/Supports/Treatments Recommendations For Services/Supports/Treatments: Medication Management, Inpatient Hospitalization, CD-IOP Intensive Chemical Dependency Program, Residential-Level 1  Disposition Recommendation per psychiatric provider: Per Tracy Schroeder patient is recommended for discharge with outpatient follow-up.    DSM5 Diagnoses: Patient Active Problem List   Diagnosis Date Noted   Alcohol abuse with intoxication (HCC) 03/03/2023   Behavior concern in adult 03/03/2023   Seasonal allergic rhinitis 06/25/2022   Gastroesophageal reflux disease 05/23/2022   Anxiety, generalized 04/24/2017   Cigarette nicotine  dependence without complication 04/22/2017   Adjustment disorder with emotional disturbance 04/15/2017   Dyslipidemia (high LDL; low HDL) 09/04/2015   Elevated LFTs 06/05/2015   Leukocytosis 06/05/2015   Allergic reaction to food 05/25/2015     Referrals to Alternative Service(s): Referred to Alternative Service(s):   Place:   Date:   Time:    Referred to Alternative Service(s):   Place:   Date:   Time:    Referred to Alternative Service(s):   Place:   Date:   Time:    Referred to Alternative Service(s):   Place:   Date:   Time:     Adalin Vanderploeg C Fey Coghill, LCMHCA

## 2023-11-25 NOTE — ED Triage Notes (Signed)
 Pt started having suicidal thoughts that started tonight. Pt states he does not have a plan. Pt states his drinking is getting out of hand and has been off medications.

## 2023-12-12 IMAGING — CT CT RENAL STONE PROTOCOL
2 of 4 series · 17 of 46 positions shown, 19 images · non-contrast
Comparison: CT abdomen and pelvis 06/28/2004.

CLINICAL DATA: Dysuria and flank pain.

EXAM:
CT ABDOMEN AND PELVIS WITHOUT CONTRAST
TECHNIQUE: Multidetector CT imaging of the abdomen and pelvis was performed
following the standard protocol without IV contrast.

[Series 2: stone full · axial · 0.80mm/px · z∈[+624,+1109]mm · 14 of 107 slices shown, 16 images]
[im 5/107  soft-tissue]
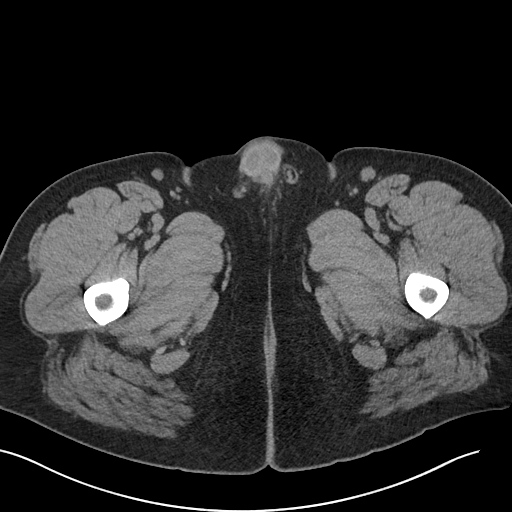
[im 5/107  bone]
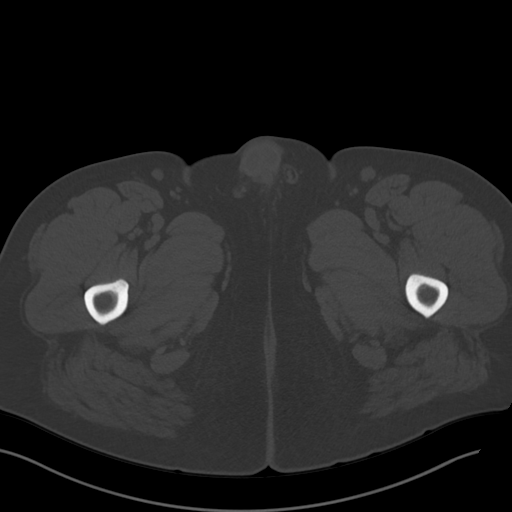
[im 13/107  soft-tissue]
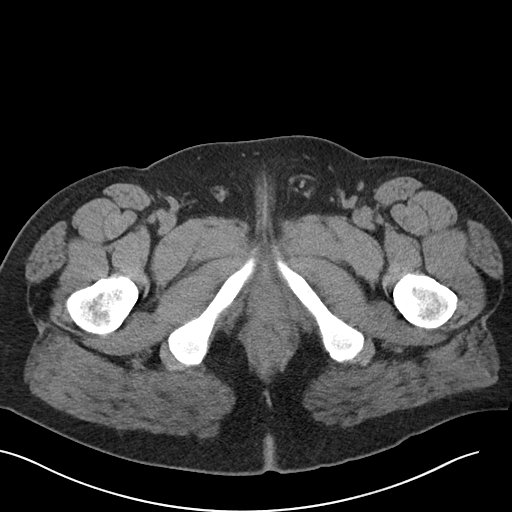
[im 22/107  soft-tissue]
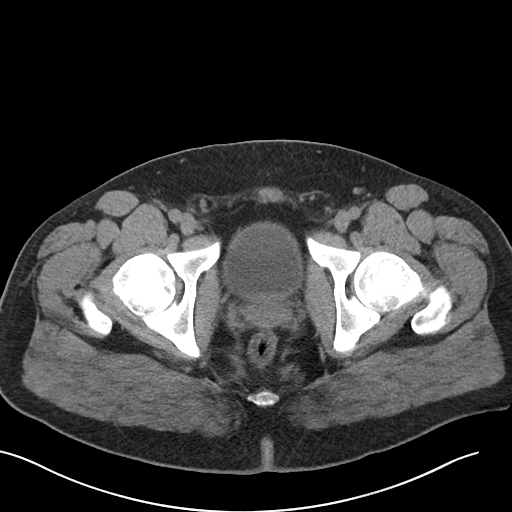
[im 30/107  soft-tissue]
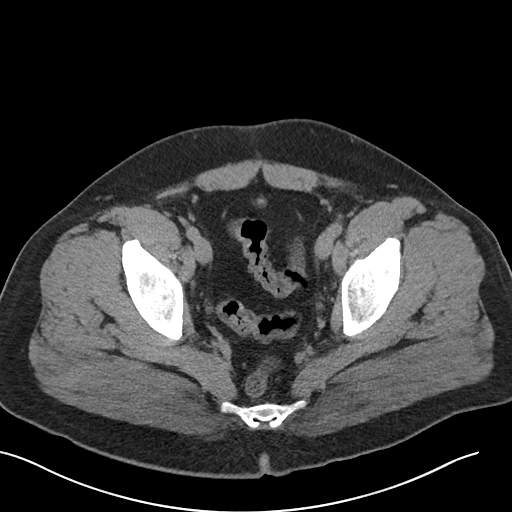
[im 34/107  soft-tissue]
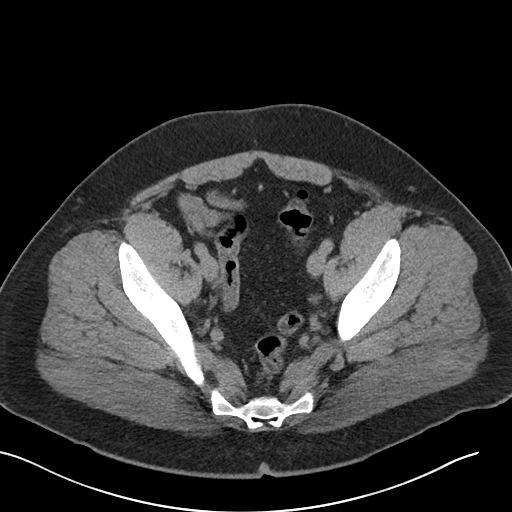
[im 43/107  soft-tissue]
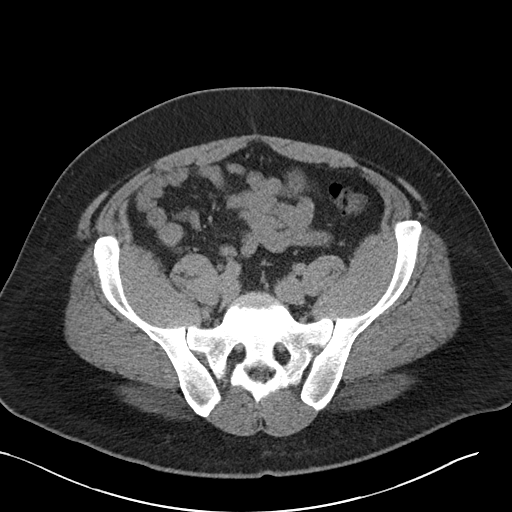
[im 51/107  soft-tissue]
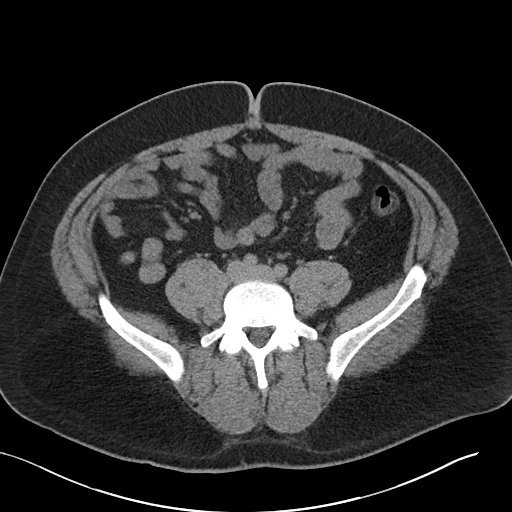
[im 56/107  soft-tissue]
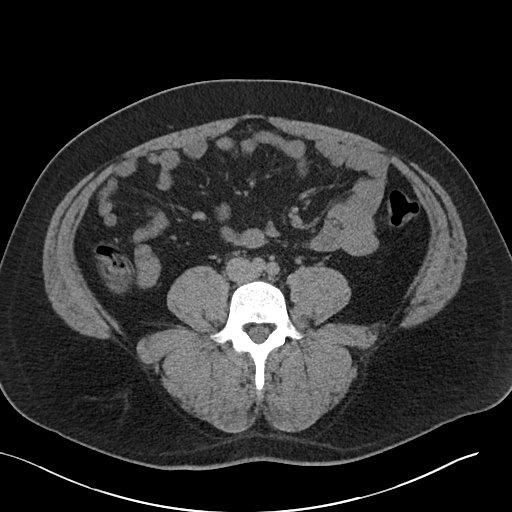
[im 64/107  soft-tissue]
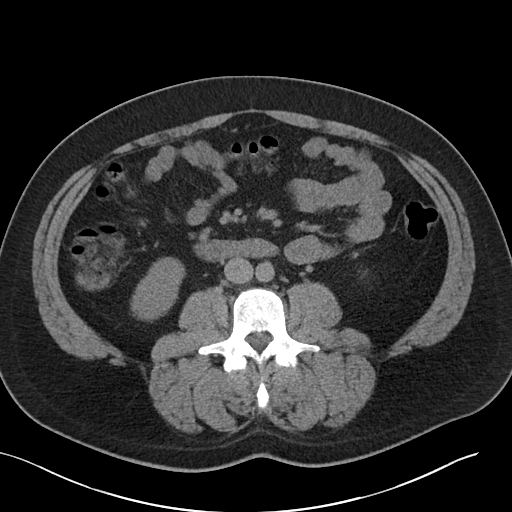
[im 64/107  bone]
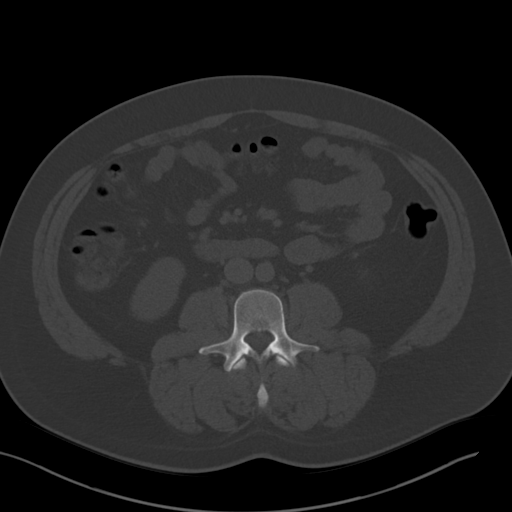
[im 73/107  soft-tissue]
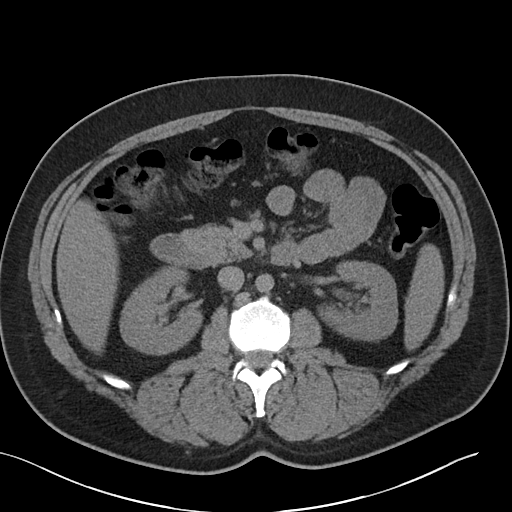
[im 81/107  soft-tissue]
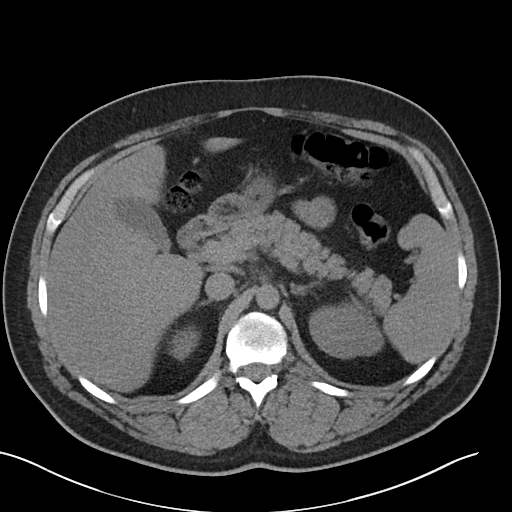
[im 85/107  soft-tissue]
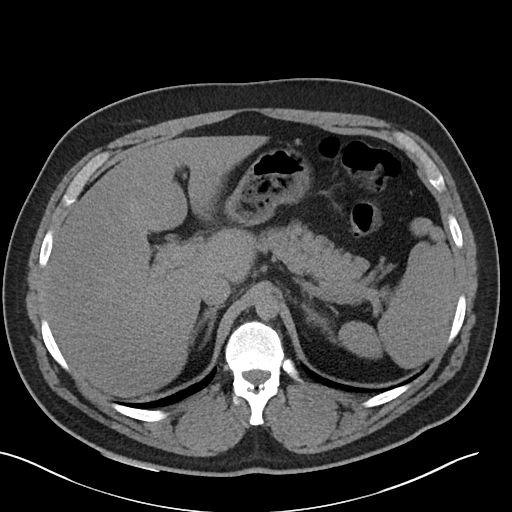
[im 94/107  soft-tissue]
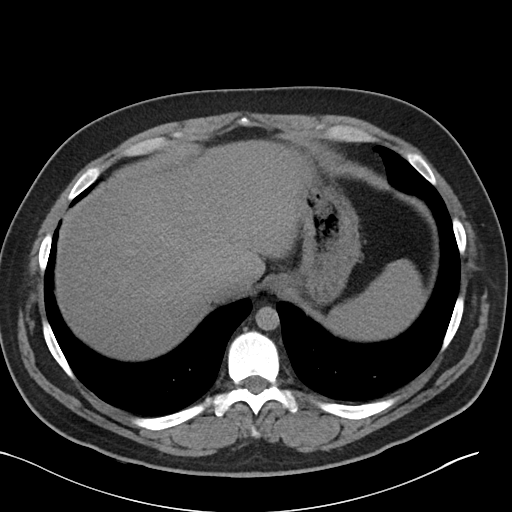
[im 102/107  soft-tissue]
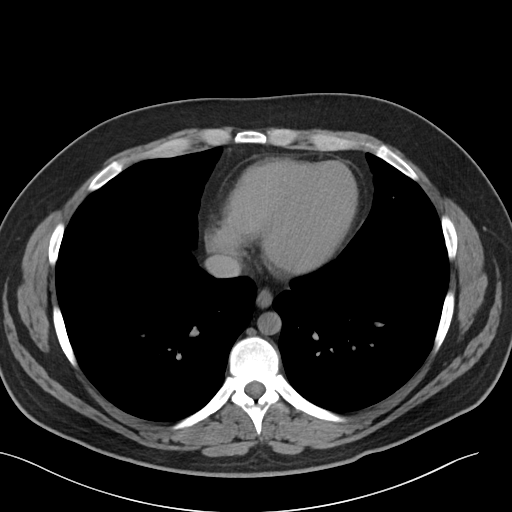

[Series 5: coronal · coronal · 0.90mm/px · 3 of 113 slices shown]
[im 38/113  soft-tissue]
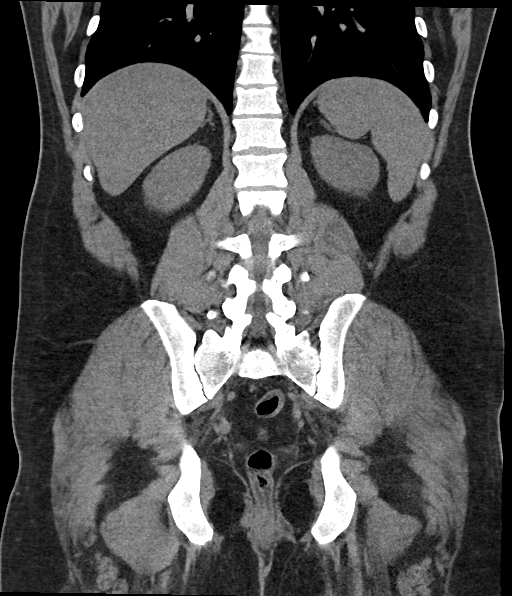
[im 50/113  soft-tissue]
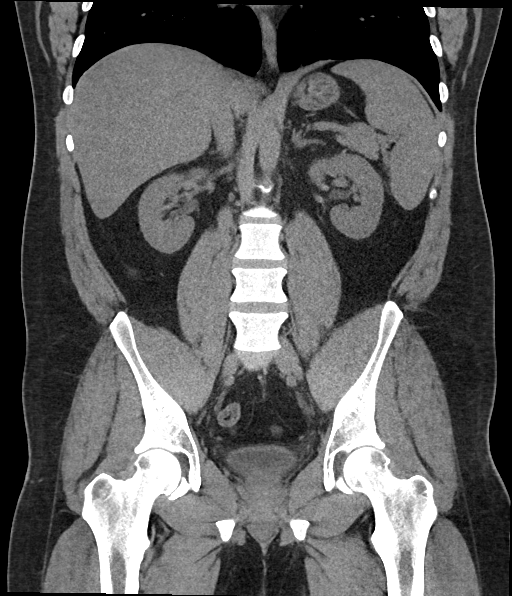
[im 63/113  soft-tissue]
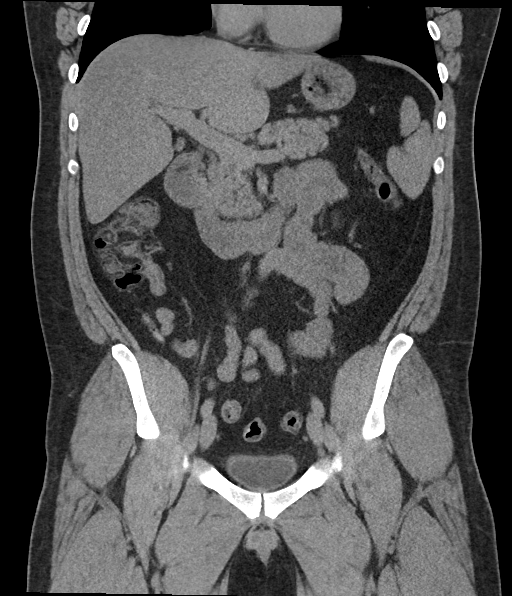

[17 of 46 positions shown; findings below may reference images not displayed]

FINDINGS: Lower chest: No acute abnormality.

Hepatobiliary: No focal liver abnormality is seen. No gallstones,
gallbladder wall thickening, or biliary dilatation.

Pancreas: Unremarkable. No pancreatic ductal dilatation or
surrounding inflammatory changes.

Spleen: Normal in size without focal abnormality.

Adrenals/Urinary Tract: There is mild inflammatory stranding
surrounding the bladder. The bladder otherwise appears within normal
limits. Kidneys and adrenal glands are within normal limits. No
urinary tract calculi.

Stomach/Bowel: Stomach is within normal limits. Appendix appears
normal. No evidence of bowel wall thickening, distention, or
inflammatory changes.

Vascular/Lymphatic: No significant vascular findings are present. No
enlarged abdominal or pelvic lymph nodes.

Reproductive: Prostate is unremarkable.

Other: There is no ascites. There is a small fat containing left
inguinal hernia.

Musculoskeletal: No acute or significant osseous findings.
IMPRESSION: 1. Mild inflammatory stranding surrounding the bladder worrisome for
cystitis.

## 2024-03-02 ENCOUNTER — Encounter (HOSPITAL_COMMUNITY): Payer: Self-pay

## 2024-03-02 ENCOUNTER — Other Ambulatory Visit: Payer: Self-pay

## 2024-03-02 ENCOUNTER — Emergency Department (HOSPITAL_COMMUNITY)
Admission: EM | Admit: 2024-03-02 | Discharge: 2024-03-02 | Disposition: A | Discharge status: Home / Self Care | Payer: Self-pay | Attending: Emergency Medicine | Admitting: Emergency Medicine

## 2024-03-02 ENCOUNTER — Emergency Department (HOSPITAL_COMMUNITY): Payer: Self-pay

## 2024-03-02 DIAGNOSIS — R10A1 Flank pain, right side: Secondary | ICD-10-CM | POA: Insufficient documentation

## 2024-03-02 DIAGNOSIS — J45909 Unspecified asthma, uncomplicated: Secondary | ICD-10-CM | POA: Insufficient documentation

## 2024-03-02 DIAGNOSIS — D72829 Elevated white blood cell count, unspecified: Secondary | ICD-10-CM | POA: Insufficient documentation

## 2024-03-02 DIAGNOSIS — F17210 Nicotine dependence, cigarettes, uncomplicated: Secondary | ICD-10-CM | POA: Insufficient documentation

## 2024-03-02 DIAGNOSIS — E871 Hypo-osmolality and hyponatremia: Secondary | ICD-10-CM | POA: Insufficient documentation

## 2024-03-02 LAB — COMPREHENSIVE METABOLIC PANEL WITH GFR
ALT: 155 U/L — ABNORMAL HIGH (ref 0–44)
AST: 100 U/L — ABNORMAL HIGH (ref 15–41)
Albumin: 4.2 g/dL (ref 3.5–5.0)
Alkaline Phosphatase: 107 U/L (ref 38–126)
Anion gap: 13 (ref 5–15)
BUN: 7 mg/dL (ref 6–20)
CO2: 25 mmol/L (ref 22–32)
Calcium: 8.7 mg/dL — ABNORMAL LOW (ref 8.9–10.3)
Chloride: 97 mmol/L — ABNORMAL LOW (ref 98–111)
Creatinine, Ser: 0.79 mg/dL (ref 0.61–1.24)
GFR, Estimated: >60 mL/min (ref >60)
Glucose, Bld: 87 mg/dL (ref 70–99)
Potassium: 3.8 mmol/L (ref 3.5–5.1)
Sodium: 134 mmol/L — ABNORMAL LOW (ref 135–145)
Total Bilirubin: 1.5 mg/dL — ABNORMAL HIGH (ref 0.0–1.2)
Total Protein: 6.6 g/dL (ref 6.5–8.1)

## 2024-03-02 LAB — CBC WITH DIFFERENTIAL/PLATELET
Abs Immature Granulocytes: 0.05 K/uL (ref 0.00–0.07)
Basophils Absolute: 0.1 K/uL (ref 0.0–0.1)
Basophils Relative: 1 %
Eosinophils Absolute: 0.4 K/uL (ref 0.0–0.5)
Eosinophils Relative: 4 %
HCT: 50.2 % (ref 39.0–52.0)
Hemoglobin: 17.5 g/dL — ABNORMAL HIGH (ref 13.0–17.0)
Immature Granulocytes: 0 %
Lymphocytes Relative: 35 %
Lymphs Abs: 4.4 K/uL — ABNORMAL HIGH (ref 0.7–4.0)
MCH: 35 pg — ABNORMAL HIGH (ref 26.0–34.0)
MCHC: 34.9 g/dL (ref 30.0–36.0)
MCV: 100.4 fL — ABNORMAL HIGH (ref 80.0–100.0)
Monocytes Absolute: 0.9 K/uL (ref 0.1–1.0)
Monocytes Relative: 7 %
Neutro Abs: 6.6 K/uL (ref 1.7–7.7)
Neutrophils Relative %: 53 %
Platelets: 216 K/uL (ref 150–400)
RBC: 5 MIL/uL (ref 4.22–5.81)
RDW: 12.1 % (ref 11.5–15.5)
WBC: 12.5 K/uL — ABNORMAL HIGH (ref 4.0–10.5)
nRBC: 0 % (ref 0.0–0.2)

## 2024-03-02 LAB — URINALYSIS, ROUTINE W REFLEX MICROSCOPIC
Bacteria, UA: NONE SEEN
Bilirubin Urine: NEGATIVE
Glucose, UA: NEGATIVE mg/dL
Hgb urine dipstick: NEGATIVE
Ketones, ur: NEGATIVE mg/dL
Nitrite: NEGATIVE
Protein, ur: NEGATIVE mg/dL
Specific Gravity, Urine: 1.001 — ABNORMAL LOW (ref 1.005–1.030)
pH: 6 (ref 5.0–8.0)

## 2024-03-02 LAB — LIPASE, BLOOD: Lipase: 42 U/L (ref 11–51)

## 2024-03-02 MED ORDER — ONDANSETRON HCL 4 MG/2ML IJ SOLN
4.0000 mg | Freq: Once | INTRAMUSCULAR | Status: AC
  Administered 2024-03-02: 4 mg via INTRAVENOUS
  Filled 2024-03-02: qty 2

## 2024-03-02 MED ORDER — IOHEXOL 300 MG/ML  SOLN
100.0000 mL | Freq: Once | INTRAMUSCULAR | Status: AC | PRN
  Administered 2024-03-02: 100 mL via INTRAVENOUS

## 2024-03-02 MED ORDER — HYDROMORPHONE HCL 1 MG/ML IJ SOLN
1.0000 mg | Freq: Once | INTRAMUSCULAR | Status: AC
  Administered 2024-03-02: 1 mg via INTRAVENOUS
  Filled 2024-03-02: qty 1

## 2024-03-02 MED ORDER — LACTATED RINGERS IV BOLUS
1000.0000 mL | Freq: Once | INTRAVENOUS | Status: AC
  Administered 2024-03-02: 1000 mL via INTRAVENOUS

## 2024-03-02 NOTE — ED Notes (Signed)
 Discharge instructions reviewed.   Opportunity for questions and concerns provided.   Alert, oriented and escorted out to family vehicle via wheelchair. Pt has safe ride home.   Displays no signs of distress.   Encouraged to follow up with PCP.

## 2024-03-02 NOTE — ED Triage Notes (Signed)
 Bilateral flank pain and dysuria x 1 week.   Initially thought it was muscular back pain and use Lidoderm  patches with no improvement.

## 2024-03-02 NOTE — ED Provider Notes (Signed)
 Emergency Department Provider Note  TRIAGE NOTE: Bilateral flank pain and dysuria x 1 week.   Initially thought it was muscular back pain and use Lidoderm  patches with no improvement.   HISTORY  Chief Complaint Flank Pain   HPI Tracy Schroeder is a 33 y.o. male with   bilateral back pain located just above the rib cage, persisting for approximately one week. The pain is described as constant and is exacerbated by palpation. The patient also reports dysuria with dark urine and has not had a bowel movement in three days. There is associated vomiting, having occurred twice prior to arrival, but no reported fevers. The patient denies any history of similar episodes. Social history is significant for smoking and daily alcohol consumption, with a recent reduction from heavy use to approximately six beers per day. The patient has a known shellfish allergy. The history was obtained from the patient.  PMH Past Medical History:  Diagnosis Date   Adjustment disorder with emotional disturbance 04/15/2017   Allergic reaction to food 05/25/2015   Anxiety    Anxiety, generalized 04/24/2017   Asthma    Cigarette nicotine  dependence without complication 04/22/2017   Dyslipidemia (high LDL; low HDL) 09/04/2015   Elevated LFTs 06/05/2015   Gastroesophageal reflux disease 05/23/2022   GERD (gastroesophageal reflux disease)    Leukocytosis 06/05/2015   Panic attacks    Seasonal allergic rhinitis 06/25/2022    Home Medications Prior to Admission medications   Medication Sig Start Date End Date Taking? Authorizing Provider  amoxicillin -clavulanate (AUGMENTIN ) 875-125 MG tablet Take 1 tablet by mouth every 12 (twelve) hours. 10/25/23   Long, Fonda MATSU, MD  doxycycline  (VIBRAMYCIN ) 100 MG capsule Take 1 capsule (100 mg total) by mouth 2 (two) times daily. 07/06/23   Robinson, John K, PA-C  loratadine  (CLARITIN ) 10 MG tablet Take 10 mg by mouth daily as needed for allergies.     [provider]   oxyCODONE -acetaminophen  (PERCOCET/ROXICET) 5-325 MG tablet Take 1 tablet by mouth every 6 (six) hours as needed for severe pain (pain score 7-10). 10/25/23   Long, Fonda MATSU, MD  pantoprazole  (PROTONIX ) 40 MG tablet Take 1 tablet by mouth daily. 05/23/22   [provider]  senna-docusate (SENOKOT-S) 8.6-50 MG tablet Take 1 tablet by mouth at bedtime as needed for mild constipation. 10/25/23   Long, Fonda MATSU, MD    Social History Social History   Tobacco Use   Smoking status: Every Day    Current packs/day: 0.50    Types: Cigarettes   Smokeless tobacco: Current  Vaping Use   Vaping status: Never Used  Substance Use Topics   Alcohol use: Yes    Comment: 6 pack daily   Drug use: No    Review of Systems: Documented in HPI ____________________________________________  PHYSICAL EXAM: VITAL SIGNS: Triage: Blood pressure 120/79, pulse 67, temperature 97.9 F (36.6 C), temperature source Oral, resp. rate 16, height 6' 4 (1.93 m), weight 129.3 kg, SpO2 94%.  Vitals:   03/02/24 0050 03/02/24 0100 03/02/24 0245 03/02/24 0517  BP:  120/79 128/77 133/74  Pulse:  67 69 73  Resp:  16 20 12   Temp: 97.9 F (36.6 C)     TempSrc: Oral     SpO2:  94% 95% 95%  Weight:      Height:        Physical Exam    ____________________________________________   LABS (all labs ordered are listed, but only abnormal results are displayed)  Labs Reviewed  CBC  WITH DIFFERENTIAL/PLATELET - Abnormal; Notable for the following components:      Result Value   WBC 12.5 (*)    Hemoglobin 17.5 (*)    MCV 100.4 (*)    MCH 35.0 (*)    Lymphs Abs 4.4 (*)    All other components within normal limits  COMPREHENSIVE METABOLIC PANEL WITH GFR - Abnormal; Notable for the following components:   Sodium 134 (*)    Chloride 97 (*)    Calcium 8.7 (*)    AST 100 (*)    ALT 155 (*)    Total Bilirubin 1.5 (*)    All other components within normal limits  URINALYSIS, ROUTINE W REFLEX MICROSCOPIC -  Abnormal; Notable for the following components:   Color, Urine STRAW (*)    Specific Gravity, Urine 1.001 (*)    Leukocytes,Ua TRACE (*)    All other components within normal limits  LIPASE, BLOOD   ____________________________________________  EKG   EKG Interpretation Date/Time:    Ventricular Rate:    PR Interval:    QRS Duration:    QT Interval:    QTC Calculation:   R Axis:      Text Interpretation:          ____________________________________________  RADIOLOGY  CT L-SPINE NO CHARGE Result Date: 03/02/2024 EXAM: CT OF THE LUMBAR SPINE WITHOUT CONTRAST 03/02/2024 02:29:52 AM TECHNIQUE: CT of the lumbar spine was performed without the administration of intravenous contrast. Multiplanar reformatted images are provided for review. Automated exposure control, iterative reconstruction, and/or weight based adjustment of the mA/kV was utilized to reduce the radiation dose to as low as reasonably achievable. COMPARISON: None available. CLINICAL HISTORY: FINDINGS: BONES AND ALIGNMENT: Normal lumbar lordosis. Normal vertebral body heights. No acute fracture or listhesis of lumbar spine. No suspicious bone lesion. DEGENERATIVE CHANGES: There is mild disc space narrowing and endplate remodeling throughout the lumbar spine keeping with changes of mild diffuse degenerative disc disease. Minimal facet arthropathy L4-L5 and L5-S1. No high-grade canal stenosis no high-grade neural foraminal narrowing. SOFT TISSUES: No acute abnormality. IMPRESSION: 1. No acute findings 2. 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 Multiplanar reformatted images are provided for review. Automated exposure control, iterative reconstruction, and/or weight-based adjustment of the mA/kV was utilized to reduce the radiation dose to as low as reasonably achievable. COMPARISON: Comparison is made to the study dated 12/31/2022. CLINICAL HISTORY: Abdominal pain, acute, nonlocalized. FINDINGS: LOWER CHEST: No acute abnormality. LIVER: Moderate hepatic steatosis. No intrahepatic mass. No intrahepatic biliary ductal dilation. GALLBLADDER AND BILE DUCTS: Gallbladder is unremarkable. No biliary ductal dilatation. SPLEEN: Stable mild splenomegaly with the spleen measuring 14.2 cm in greatest dimension. No intrasplenic lesions are seen. PANCREAS: No acute abnormality. ADRENAL GLANDS: No acute abnormality. KIDNEYS, URETERS AND BLADDER: No stones in the kidneys or ureters. No hydronephrosis. No perinephric or periureteral stranding. Urinary bladder is unremarkable. GI AND BOWEL: Appendix normal. The stomach, small bowel, and large bowel are otherwise unremarkable. There is no bowel obstruction. PERITONEUM AND RETROPERITONEUM: No ascites. No free air. VASCULATURE: Aorta is normal in caliber. LYMPH NODES:  No lymphadenopathy. REPRODUCTIVE ORGANS: No acute abnormality. BONES AND SOFT TISSUES: No acute osseous abnormality. No focal soft tissue abnormality. IMPRESSION: 1. No acute findings 2. Stable mild splenomegaly 3. Moderate hepatic steatosis, unchanged Electronically signed by: Dorethia Molt MD 03/02/2024 02:50 AM EST RP Workstation: [id number]   ____________________________________________  PROCEDURES  Procedure(s) performed:   Procedures ____________________________________________  INITIAL IMPRESSION / ASSESSMENT AND PLAN   Clinical Course as of 03/02/24 0627  Tue Mar 02, 2024  0125 Initial Evaluation:  - Patient presents with a week-long history of  bilateral back pain above the rib cage, dysuria with dark urine, and constipation for three days, with recent vomiting before arrival.   Plan:  - Obtain bladder scan and urinalysis to assess for urinary retention or infection. - Consider further imaging with CT scan of the abdomen and pelvis to evaluate for kidney stones if indicated. - Manage symptomatically with analgesics and hydration. - Guide further treatment based on diagnostic results. [JM]  0126 Bladder scan 178 [JM]    Clinical Course User Index [JM] Shaquia Berkley, Selinda, MD     Images ordered viewed and obtained by myself. Agree with Radiology interpretation. Details in ED course.  Labs ordered reviewed by myself as detailed in ED course.  Consultations obtained/considered detailed in ED course.    CRITICAL INTERVENTIONS:  N/a  The patient presents with a week-long history of bilateral back pain just above the rib cage, accompanied by dysuria with dark urine and constipation for three days. He reports nausea with two episodes of vomiting prior to arrival. The patient has a history of significant alcohol consumption and a shellfish allergy. Physical examination reveals tenderness across the sides of the back and around the umbilical region, but no pain in the lower abdomen.  The differential diagnosis includes nephrolithiasis, urinary tract infection, pyelonephritis, acute kidney injury, and alcohol-related liver disease. Considered but less likely diagnoses include spinal issues such as arthritis or disc disease, given the location of the back pain, and gastrointestinal causes like bowel obstruction or pancreatitis.  The workup included a CBC, CMP, lipase, urinalysis, and CT imaging of the lumbar spine and abdomen/pelvis. The CBC showed a slightly elevated white count and hemoglobin, likely due to dehydration. The CMP revealed elevated AST and ALT, consistent with alcohol use, but normal kidney function. Lipase was normal, ruling out  pancreatitis. Urinalysis indicated low specific gravity without infection or hematuria, making a urinary tract infection or kidney stone unlikely. CT imaging showed minor arthritic changes in the lumbar spine and mild splenomegaly, with stable hepatic steatosis. There was no evidence of nephrolithiasis, biliary pathology, appendicitis, or diverticulitis.  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 The patient is stable, with pain controlled and no indication for hospitalization or further immediate workup. The plan includes advising the patient on hydration and reducing alcohol intake, with follow-up for liver function monitoring. The patient was discharged with instructions to return if symptoms worsen or new symptoms develop.   FINAL IMPRESSION Final diagnoses:  Right flank pain     Disposition A medical screening exam was performed and I feel the patient has had an appropriate workup for their chief complaint at this time and likelihood of emergent condition existing is low. They have been counseled on decision, DISCHARGE, follow up and which symptoms necessitate immediate return to the emergency department. They or their family verbally stated understanding and agreement with plan and discharged in stable condition.   ____________________________________________   NEW OUTPATIENT  MEDICATIONS STARTED DURING THIS VISIT:  Discharge Medication List as of 03/02/2024  5:08 AM      Note:  This note was prepared with assistance of Dragon voice recognition software. Occasional wrong-word or sound-a-like substitutions may have occurred due to the inherent limitations of voice recognition software.    Lorette Mayo, MD 03/02/24 [phone]

## 2024-03-11 ENCOUNTER — Encounter (HOSPITAL_COMMUNITY): Payer: Self-pay

## 2024-03-11 ENCOUNTER — Emergency Department (HOSPITAL_COMMUNITY): Payer: Self-pay

## 2024-03-11 ENCOUNTER — Emergency Department (HOSPITAL_COMMUNITY)
Admission: EM | Admit: 2024-03-11 | Discharge: 2024-03-11 | Disposition: A | Discharge status: Home / Self Care | Payer: Self-pay | Attending: Emergency Medicine | Admitting: Emergency Medicine

## 2024-03-11 ENCOUNTER — Other Ambulatory Visit: Payer: Self-pay

## 2024-03-11 DIAGNOSIS — R10A2 Flank pain, left side: Secondary | ICD-10-CM | POA: Insufficient documentation

## 2024-03-11 DIAGNOSIS — F419 Anxiety disorder, unspecified: Secondary | ICD-10-CM | POA: Insufficient documentation

## 2024-03-11 DIAGNOSIS — R34 Anuria and oliguria: Secondary | ICD-10-CM | POA: Insufficient documentation

## 2024-03-11 DIAGNOSIS — R1031 Right lower quadrant pain: Secondary | ICD-10-CM | POA: Insufficient documentation

## 2024-03-11 DIAGNOSIS — D72829 Elevated white blood cell count, unspecified: Secondary | ICD-10-CM | POA: Insufficient documentation

## 2024-03-11 DIAGNOSIS — M549 Dorsalgia, unspecified: Secondary | ICD-10-CM | POA: Insufficient documentation

## 2024-03-11 LAB — CBC WITH DIFFERENTIAL/PLATELET
Abs Immature Granulocytes: 0.03 K/uL (ref 0.00–0.07)
Basophils Absolute: 0.2 K/uL — ABNORMAL HIGH (ref 0.0–0.1)
Basophils Relative: 1 %
Eosinophils Absolute: 0.5 K/uL (ref 0.0–0.5)
Eosinophils Relative: 4 %
HCT: 49.5 % (ref 39.0–52.0)
Hemoglobin: 17.7 g/dL — ABNORMAL HIGH (ref 13.0–17.0)
Immature Granulocytes: 0 %
Lymphocytes Relative: 34 %
Lymphs Abs: 3.9 K/uL (ref 0.7–4.0)
MCH: 35.3 pg — ABNORMAL HIGH (ref 26.0–34.0)
MCHC: 35.8 g/dL (ref 30.0–36.0)
MCV: 98.8 fL (ref 80.0–100.0)
Monocytes Absolute: 0.9 K/uL (ref 0.1–1.0)
Monocytes Relative: 8 %
Neutro Abs: 6.1 K/uL (ref 1.7–7.7)
Neutrophils Relative %: 53 %
Platelets: 272 K/uL (ref 150–400)
RBC: 5.01 MIL/uL (ref 4.22–5.81)
RDW: 12.3 % (ref 11.5–15.5)
WBC: 11.6 K/uL — ABNORMAL HIGH (ref 4.0–10.5)
nRBC: 0 % (ref 0.0–0.2)

## 2024-03-11 LAB — BASIC METABOLIC PANEL WITH GFR
Anion gap: 13 (ref 5–15)
BUN: 6 mg/dL (ref 6–20)
CO2: 23 mmol/L (ref 22–32)
Calcium: 8.8 mg/dL — ABNORMAL LOW (ref 8.9–10.3)
Chloride: 103 mmol/L (ref 98–111)
Creatinine, Ser: 0.83 mg/dL (ref 0.61–1.24)
GFR, Estimated: >60 mL/min (ref >60)
Glucose, Bld: 95 mg/dL (ref 70–99)
Potassium: 3.6 mmol/L (ref 3.5–5.1)
Sodium: 138 mmol/L (ref 135–145)

## 2024-03-11 LAB — URINALYSIS, ROUTINE W REFLEX MICROSCOPIC
Bacteria, UA: NONE SEEN
Bilirubin Urine: NEGATIVE
Glucose, UA: NEGATIVE mg/dL
Hgb urine dipstick: NEGATIVE
Ketones, ur: NEGATIVE mg/dL
Nitrite: NEGATIVE
Protein, ur: NEGATIVE mg/dL
Specific Gravity, Urine: 1.001 — ABNORMAL LOW (ref 1.005–1.030)
pH: 6 (ref 5.0–8.0)

## 2024-03-11 NOTE — Discharge Instructions (Signed)
 Profen 600 mg every 6 hours as needed for pain.  Drink plenty of fluids.  Follow-up with primary doctor if symptoms persist, and return to the ER if symptoms significantly worsen or change.

## 2024-03-11 NOTE — ED Provider Notes (Signed)
 [place] EMERGENCY DEPARTMENT AT [facility] Provider Note   CSN: [id number] Arrival date & time: 03/11/24  0036     Patient presents with: Panic Attack, Constipation, and Chest Pain   Tracy Schroeder is a 33 y.o. male.   Patient is a 34 year old male with past medical history of GERD, anxiety, kidney stones.  Patient presenting today with multiple complaints.  Patient describes having discomfort to his right groin and abdomen.  He also describes discomfort to the left flank.  Symptoms have been ongoing since yesterday.  He reports having not urinated in 3 days and also has not had a bowel movement in 2 days.  No fevers or chills.  He was able to urinate shortly after arrival here.  He states he had some sort of anxiety attack prior to coming here he feels as related to his physical symptoms and also the anniversary of the death of a family member.       Prior to Admission medications   Medication Sig Start Date End Date Taking? Authorizing Provider  amoxicillin -clavulanate (AUGMENTIN ) 875-125 MG tablet Take 1 tablet by mouth every 12 (twelve) hours. 10/25/23   Long, Fonda MATSU, MD  doxycycline  (VIBRAMYCIN ) 100 MG capsule Take 1 capsule (100 mg total) by mouth 2 (two) times daily. 07/06/23   Robinson, John K, PA-C  loratadine  (CLARITIN ) 10 MG tablet Take 10 mg by mouth daily as needed for allergies.     [provider]  oxyCODONE -acetaminophen  (PERCOCET/ROXICET) 5-325 MG tablet Take 1 tablet by mouth every 6 (six) hours as needed for severe pain (pain score 7-10). 10/25/23   Long, Fonda MATSU, MD  pantoprazole  (PROTONIX ) 40 MG tablet Take 1 tablet by mouth daily. 05/23/22   [provider]  senna-docusate (SENOKOT-S) 8.6-50 MG tablet Take 1 tablet by mouth at bedtime as needed for mild constipation. 10/25/23   Long, Darril G, MD    Allergies: Shellfish allergy    Review of Systems  All other systems reviewed and are negative.   Updated Vital Signs BP 121/74    Pulse 78   Temp 97.9 F (36.6 C) (Oral)   Resp (!) 28   Ht 6' 4 (1.93 m)   Wt 129.3 kg   SpO2 92%   BMI 34.69 kg/m   Physical Exam Vitals and nursing note reviewed.  Constitutional:      General: He is not in acute distress.    Appearance: He is well-developed. He is not diaphoretic.  HENT:     Head: Normocephalic and atraumatic.  Cardiovascular:     Rate and Rhythm: Normal rate and regular rhythm.     Heart sounds: No murmur heard.    No friction rub.  Pulmonary:     Effort: Pulmonary effort is normal. No respiratory distress.     Breath sounds: Normal breath sounds. No wheezing or rales.  Abdominal:     General: Bowel sounds are normal. There is no distension.     Palpations: Abdomen is soft.     Tenderness: There is no abdominal tenderness.     Comments: There is tenderness to palpation noted in the right lower quadrant and inguinal region.  No palpable hernia.  There is also mild left-sided CVA tenderness.  Musculoskeletal:        General: Normal range of motion.     Cervical back: Normal range of motion and neck supple.  Skin:    General: Skin is warm and dry.  Neurological:     Mental  Status: He is alert and oriented to person, place, and time.     Coordination: Coordination normal.     (all labs ordered are listed, but only abnormal results are displayed) Labs Reviewed - No data to display  EKG: None  Radiology: No results found.   Procedures   Medications Ordered in the ED - No data to display                                  Medical Decision Making Amount and/or Complexity of Data Reviewed Labs: ordered. Radiology: ordered.   Patient is a 33 year old male with history of anxiety and kidney stones presenting with back and abdominal pain, anxiety, and decreased urination and bowel movements over the past few days.  Patient arrives here with stable vital signs.  Physical examination is basically unremarkable.  Laboratory studies obtained  including CBC and basic metabolic panel, both of which are unremarkable.  Urinalysis is clear.  CT with renal protocol obtained showing no acute intra-abdominal process.  There is no evidence for bowel obstruction or obstructing calculi.  At this point, because of the patient's discomfort and decreased urinary and stool output is unclear, but there is no evidence for obstruction or blockage.  His renal function is normal and CT scan fails to reveal a kidney stone or evidence for urinary retention.  Patient to be discharged with plenty of fluids, rest, ibuprofen , and follow-up as needed.     Final diagnoses:  None    ED Discharge Orders     None          Geroldine Berg, MD 03/11/24 [phone]

## 2024-03-11 NOTE — ED Triage Notes (Signed)
 Pt bib ems from home complains of panic attacks, cp, No BM in 3 days and not able to urinate for 2 days. Pt is alert and oriented x4, ambulatory. States that he had 6 beers tonight. Pt says that panic attack brought on by anniversary of family member death. EMS gave 324 of asprin pta, pt states that helped with CP. Pt also endorse pain in right groin area. Pt urinated once he arrived in room, urine sample collected.

## 2024-04-01 ENCOUNTER — Encounter (HOSPITAL_COMMUNITY): Payer: Self-pay

## 2024-04-01 ENCOUNTER — Emergency Department (HOSPITAL_COMMUNITY)
Admission: EM | Admit: 2024-04-01 | Discharge: 2024-04-02 | Payer: Self-pay | Attending: Emergency Medicine | Admitting: Emergency Medicine

## 2024-04-01 ENCOUNTER — Other Ambulatory Visit: Payer: Self-pay

## 2024-04-01 DIAGNOSIS — R519 Headache, unspecified: Secondary | ICD-10-CM | POA: Insufficient documentation

## 2024-04-01 DIAGNOSIS — Z5321 Procedure and treatment not carried out due to patient leaving prior to being seen by health care provider: Secondary | ICD-10-CM | POA: Insufficient documentation

## 2024-04-01 DIAGNOSIS — M542 Cervicalgia: Secondary | ICD-10-CM | POA: Insufficient documentation

## 2024-04-01 DIAGNOSIS — M79605 Pain in left leg: Secondary | ICD-10-CM | POA: Insufficient documentation

## 2024-04-01 DIAGNOSIS — M549 Dorsalgia, unspecified: Secondary | ICD-10-CM | POA: Insufficient documentation

## 2024-04-01 DIAGNOSIS — W131XXA Fall from, out of or through bridge, initial encounter: Secondary | ICD-10-CM | POA: Insufficient documentation

## 2024-04-01 NOTE — ED Triage Notes (Signed)
 Pt BIB EMS from home. Pt states he's been drinking on and off today. He went for a walk x 2 hrs ago and fell off a bridge into a creek. Pt was able to get himself up and walk home about 400 ft. States he hit his head. C/o headache, back, neck pain. Left leg pain with + pedal pulse. No obvious deformity.

## 2024-04-02 NOTE — ED Notes (Signed)
 Patient expressed to this nurse the desire to sign out AMA. This nurse educated patient on potential risks of not being seen by provider and getting images. Pt insisted that IV get removed. AMA form was signed and education provided on returning to ED for any changes in status.

## 2024-04-07 NOTE — ED Notes (Signed)
 Pt discharged home.  Pt verbalized understanding of discharge instructions.  Pt A&O x4 with steady gait and in no respiratory distress upon discharge.

## 2024-05-01 ENCOUNTER — Other Ambulatory Visit: Payer: Self-pay

## 2024-05-01 ENCOUNTER — Emergency Department (HOSPITAL_COMMUNITY)
Admission: EM | Admit: 2024-05-01 | Discharge: 2024-05-01 | Disposition: A | Discharge status: Home / Self Care | Payer: Self-pay | Attending: Emergency Medicine | Admitting: Emergency Medicine

## 2024-05-01 ENCOUNTER — Encounter (HOSPITAL_COMMUNITY): Payer: Self-pay | Admitting: Emergency Medicine

## 2024-05-01 ENCOUNTER — Emergency Department (HOSPITAL_COMMUNITY): Payer: Self-pay

## 2024-05-01 DIAGNOSIS — M25522 Pain in left elbow: Secondary | ICD-10-CM | POA: Insufficient documentation

## 2024-05-01 DIAGNOSIS — S0990XA Unspecified injury of head, initial encounter: Secondary | ICD-10-CM | POA: Insufficient documentation

## 2024-05-01 DIAGNOSIS — W19XXXA Unspecified fall, initial encounter: Secondary | ICD-10-CM

## 2024-05-01 DIAGNOSIS — J45909 Unspecified asthma, uncomplicated: Secondary | ICD-10-CM | POA: Insufficient documentation

## 2024-05-01 DIAGNOSIS — M25531 Pain in right wrist: Secondary | ICD-10-CM | POA: Insufficient documentation

## 2024-05-01 DIAGNOSIS — M542 Cervicalgia: Secondary | ICD-10-CM | POA: Insufficient documentation

## 2024-05-01 DIAGNOSIS — W130XXA Fall from, out of or through balcony, initial encounter: Secondary | ICD-10-CM | POA: Insufficient documentation

## 2024-05-01 MED ORDER — ACETAMINOPHEN 325 MG PO TABS
650.0000 mg | ORAL_TABLET | Freq: Once | ORAL | Status: AC
  Administered 2024-05-01: 650 mg via ORAL
  Filled 2024-05-01: qty 2

## 2024-05-01 MED ORDER — IBUPROFEN 400 MG PO TABS
400.0000 mg | ORAL_TABLET | Freq: Once | ORAL | Status: AC
  Administered 2024-05-01: 400 mg via ORAL
  Filled 2024-05-01: qty 1

## 2024-05-01 NOTE — ED Triage Notes (Signed)
 Pt here after falling off porch (states his chicken got under his feet) around 1am. Pt with c/o L elbow pain, R wrist pain, and neck pain. States he has been drinking ETOH tonight. Denies LOC.

## 2024-05-01 NOTE — Discharge Instructions (Addendum)
 Apply ice for 30 minutes at a time, 4 times a day.  You may take acetaminophen /or ibuprofen  as needed for pain.  Please be aware that when you combine acetaminophen  and ibuprofen , you will get better pain relief and you get from taking either medication by itself.  It is very important that you follow-up with the orthopedic hand specialist.  Although the x-rays did not show it, there is a high likelihood that there is a broken bone in your wrist and if it is not adequately treated can lead to permanent arthritis.

## 2024-05-01 NOTE — ED Provider Notes (Signed)
 " Tracy Schroeder EMERGENCY DEPARTMENT AT [facility] Provider Note   CSN: [id number] Arrival date & time: 05/01/24  9780     Patient presents with: [name]   Tracy Schroeder is a 34 y.o. male.   The history is provided by the patient.  Fall   He has history of hyperlipidemia, asthma, GERD and comes in after falling off of a porch with an estimated 3-4 foot drop.  He is complaining of pain in his right wrist, left elbow, neck and is also complaining of a headache.  He does admit to having had 6 beers earlier in the evening.  He denies loss of consciousness.    Prior to Admission medications  Medication Sig Start Date End Date Taking? Authorizing Provider  amoxicillin -clavulanate (AUGMENTIN ) 875-125 MG tablet Take 1 tablet by mouth every 12 (twelve) hours. 10/25/23   Long, Fonda MATSU, MD  doxycycline  (VIBRAMYCIN ) 100 MG capsule Take 1 capsule (100 mg total) by mouth 2 (two) times daily. 07/06/23   Robinson, John K, PA-C  loratadine  (CLARITIN ) 10 MG tablet Take 10 mg by mouth daily as needed for allergies.     [provider]  oxyCODONE -acetaminophen  (PERCOCET/ROXICET) 5-325 MG tablet Take 1 tablet by mouth every 6 (six) hours as needed for severe pain (pain score 7-10). 10/25/23   Long, Fonda MATSU, MD  pantoprazole  (PROTONIX ) 40 MG tablet Take 1 tablet by mouth daily. 05/23/22   [provider]  senna-docusate (SENOKOT-S) 8.6-50 MG tablet Take 1 tablet by mouth at bedtime as needed for mild constipation. 10/25/23   Long, Deyonte G, MD    Allergies: Shellfish allergy    Review of Systems  All other systems reviewed and are negative.   Updated Vital Signs BP 121/77 (BP Location: Right Arm)   Pulse 71   Temp 98.4 F (36.9 C) (Oral)   Resp 18   Ht 6' 4 (1.93 m)   Wt 129.3 kg   SpO2 95%   BMI 34.69 kg/m   Physical Exam Vitals and nursing note reviewed.   34 year old male, resting comfortably and in no acute distress. Vital signs are normal. Oxygen saturation is  95%, which is normal. Head is normocephalic and atraumatic. PERRLA, EOMI.  Neck is mildly tender in the midline without point tenderness. Back is nontender and there is no CVA tenderness. Lungs are clear without rales, wheezes, or rhonchi. Chest is nontender. Heart has regular rate and rhythm without murmur. Abdomen is soft, flat, nontender. Extremities there is no swelling or deformity.  There is tenderness palpation rather diffusely throughout the left elbow but full passive range of motion is present.  There is tenderness palpation over the right wrist which is maximal over the anatomic snuffbox.  There is pain with axial loading of the thumb.  There is no point tenderness.  No other extremity injury seen. Skin is warm and dry without rash. Neurologic: Awake and alert, moves all extremities equally.    Radiology: DG Wrist Complete Right Result Date: 05/01/2024 EXAM: 3 OR MORE VIEW(S) XRAY OF THE RIGHT WRIST 05/01/2024 03:17:00 AM COMPARISON: None available. CLINICAL HISTORY: fall with pain FINDINGS: BONES AND JOINTS: No acute fracture. No malalignment. SOFT TISSUES: There is mild dorsal soft tissue swelling. IMPRESSION: 1. No acute fracture or dislocation. 2. Mild dorsal soft tissue swelling. Electronically signed by: Francis Quam MD 05/01/2024 03:37 AM EST RP Workstation: [id number]   DG Elbow Complete Left Result Date: 05/01/2024 EXAM: 3 VIEW(S) XRAY OF THE LEFT ELBOW COMPARISON: None  available. CLINICAL HISTORY: fall with pain FINDINGS: BONES AND JOINTS: There is a small bone island in the lateral humeral condyle. No acute fracture. No malalignment. SOFT TISSUES: There are artifacts from overlying clothing. IMPRESSION: 1. No acute fracture or dislocation. Electronically signed by: Francis Quam MD 05/01/2024 03:28 AM EST RP Workstation: [id number]     .Splint Application  Date/Time: 05/01/2024 5:31 AM  Performed by: Raford Lenis, MD Authorized by: Raford Lenis, MD   Consent:     Consent obtained:  Verbal   Consent given by:  Patient   Risks, benefits, and alternatives were discussed: yes     Risks discussed:  Discoloration, numbness, pain and swelling   Alternatives discussed:  Alternative treatment Universal protocol:    Procedure explained and questions answered to patient or proxy's satisfaction: yes     Relevant documents present and verified: yes     Test results available: yes     Imaging studies available: yes     Required blood products, implants, devices, and special equipment available: yes     Site/side marked: yes     Immediately prior to procedure a time out was called: yes     Patient identity confirmed:  Verbally with patient and arm band Pre-procedure details:    Distal neurologic exam:  Normal   Distal perfusion: brisk capillary refill   Procedure details:    Location:  Wrist   Wrist location:  R wrist   Strapping: no     Splint type:  Thumb spica   Supplies:  Elastic bandage, cotton padding and fiberglass Post-procedure details:    Distal neurologic exam:  Unchanged   Distal perfusion: unchanged     Procedure completion:  Tolerated well, no immediate complications   Post-procedure imaging: not applicable      Medications Ordered in the ED - No data to display                                  Medical Decision Making Amount and/or Complexity of Data Reviewed Radiology: ordered.  Risk OTC drugs. Prescription drug management.   Fall with injury to head, neck, right wrist, left elbow.  Wrist exam is concerning for scaphoid fracture.  X-rays of left elbow and right wrist are negative for fracture.  I have independently viewed the images, and agree with the radiologist's interpretation.  However, based on clinical exam I am very concerned about occult scaphoid fracture so I am applying a thumb spica splint and referring him to hand surgery for follow-up.  I have ordered CT scans of head and cervical spine which showed no evidence of  acute traumatic injury.  Have independently viewed the images, and agree with the radiologist's interpretation.  I am discharging him with instructions apply ice and use over-the-counter NSAIDs and acetaminophen  as needed for pain.  He will need to follow-up with the orthopedic hand specialist.     Final diagnoses:  Fall at home, initial encounter  Pain in joint of right wrist    ED Discharge Orders     None          Raford Lenis, MD 05/01/24 0533  "
